# Patient Record
Sex: Male | Born: 1960 | Race: White | Hispanic: No | Marital: Married | Smoking: Current every day smoker
Health system: Southern US, Community
[De-identification: ages and names within clinical notes are randomized; demographics above are authoritative.]

## PROBLEM LIST (undated history)

## (undated) DIAGNOSIS — I10 Essential (primary) hypertension: Secondary | ICD-10-CM

## (undated) DIAGNOSIS — E785 Hyperlipidemia, unspecified: Secondary | ICD-10-CM

## (undated) DIAGNOSIS — M545 Low back pain, unspecified: Secondary | ICD-10-CM

## (undated) DIAGNOSIS — G8929 Other chronic pain: Secondary | ICD-10-CM

## (undated) HISTORY — DX: Hyperlipidemia, unspecified: E78.5

## (undated) HISTORY — DX: Low back pain, unspecified: M54.50

## (undated) HISTORY — PX: OTHER SURGICAL HISTORY: SHX169

## (undated) HISTORY — DX: Other chronic pain: G89.29

## (undated) HISTORY — DX: Essential (primary) hypertension: I10

---

## 2007-09-24 ENCOUNTER — Emergency Department: Payer: Self-pay | Admitting: Emergency Medicine

## 2015-02-18 ENCOUNTER — Encounter: Payer: Self-pay | Admitting: Emergency Medicine

## 2015-02-18 ENCOUNTER — Emergency Department
Admission: EM | Admit: 2015-02-18 | Discharge: 2015-02-18 | Disposition: A | Discharge status: Home / Self Care | Payer: BLUE CROSS/BLUE SHIELD | Attending: Emergency Medicine | Admitting: Emergency Medicine

## 2015-02-18 DIAGNOSIS — M545 Low back pain: Secondary | ICD-10-CM | POA: Diagnosis not present

## 2015-02-18 DIAGNOSIS — M5410 Radiculopathy, site unspecified: Secondary | ICD-10-CM

## 2015-02-18 DIAGNOSIS — M25551 Pain in right hip: Secondary | ICD-10-CM | POA: Diagnosis present

## 2015-02-18 MED ORDER — HYDROMORPHONE HCL 1 MG/ML IJ SOLN
1.0000 mg | Freq: Once | INTRAMUSCULAR | Status: AC
  Administered 2015-02-18: 1 mg via INTRAMUSCULAR
  Filled 2015-02-18: qty 1

## 2015-02-18 MED ORDER — TRAMADOL HCL 50 MG PO TABS: 50.0000 mg | ORAL_TABLET | Freq: Four times a day (QID) | ORAL | Status: AC | PRN

## 2015-02-18 NOTE — ED Notes (Signed)
Pt to ed with c/o right hip pain started Tuesday. Was given prednisone and flexeril for sciatica, but meds are not helping. Pt ambulated to stat desk with no difficulty noted.

## 2015-02-18 NOTE — ED Provider Notes (Signed)
[facility] Emergency Department Provider Note  ____________________________________________  Time seen: Approximately 6:47 PM  I have reviewed the triage vital signs and the nursing notes.   HISTORY  Chief Complaint Hip Pain    HPI Jeffrey Hunter is a 54 y.o. male patient complain of right hip back pain for 2 days. [facility] and diagnosed Sciatica. Patient given prescription for prednisone and Flexeril.Patient states continue pain and numbness to the right lower extremity. Patient denies any bladder or bowel dysfunction. She rates his pain discomfort as a 10 over 10. Scrub the pain in his back as sharp and a numbness to right entire leg.   History reviewed. No pertinent past medical history.  There are no active problems to display for this patient.   History reviewed. No pertinent past surgical history.  No current outpatient prescriptions on file.  Allergies Review of patient's allergies indicates no known allergies.  No family history on file.  Social History Social History  Substance Use Topics  . Smoking status: None  . Smokeless tobacco: None  . Alcohol Use: None    Review of Systems Constitutional: No fever/chills Eyes: No visual changes. ENT: No sore throat. Cardiovascular: Denies chest pain. Respiratory: Denies shortness of breath. Gastrointestinal: No abdominal pain.  No nausea, no vomiting.  No diarrhea.  No constipation. Genitourinary: Negative for dysuria. Musculoskeletal: Negative for back pain. Skin: Negative for rash. Neurological: Negative for headaches. Numbness to right leg. 10-point ROS otherwise negative.  ____________________________________________   PHYSICAL EXAM:  VITAL SIGNS: ED Triage Vitals  Enc Vitals Group     BP 02/18/15 1824 169/85 mmHg     Pulse Rate 02/18/15 1824 76     Resp 02/18/15 1824 20     Temp 02/18/15 1824 98.4 F (36.9 C)     Temp Source  02/18/15 1824 Oral     SpO2 02/18/15 1824 99 %     Weight 02/18/15 1824 200 lb (90.719 kg)     Height 02/18/15 1824 5\' 9"  (1.753 m)     Head Cir --      Peak Flow --      Pain Score 02/18/15 1819 10     Pain Loc --      Pain Edu? --      Excl. in GC? --     Constitutional: Alert and oriented. Well appearing and in no acute distress. Eyes: Conjunctivae are normal. PERRL. EOMI. Head: Atraumatic. Nose: No congestion/rhinnorhea. Mouth/Throat: Mucous membranes are moist.  Oropharynx non-erythematous. Neck: No stridor.  No cervical spine tenderness to palpation. Hematological/Lymphatic/Immunilogical: No cervical lymphadenopathy. Cardiovascular: Normal rate, regular rhythm. Grossly normal heart sounds.  Good peripheral circulation. Respiratory: Normal respiratory effort.  No retractions. Lungs CTAB. Gastrointestinal: Soft and nontender. No distention. No abdominal bruits. No CVA tenderness. Musculoskeletal: Patient sits to stand repeat reliance on the upper extremities. No spinal deformity however the patient stays in a flexed position upon standing. Patient has some moderate guarding with palpation of L3-L5. Patient has a straight leg test at 60. Neurologic:  Normal speech and language. No gross focal neurologic deficits are appreciated. No gait instability. Skin:  Skin is warm, dry and intact. No rash noted. Psychiatric: Mood and affect are normal. Speech and behavior are normal.  ____________________________________________   LABS (all labs ordered are listed, but only abnormal results are displayed)  Labs Reviewed - No data to display ____________________________________________  EKG   ____________________________________________  RADIOLOGY  ____________________________________________   PROCEDURES  Procedure(s) performed: None  Critical Care performed: No  ____________________________________________   INITIAL IMPRESSION / ASSESSMENT AND PLAN / ED  COURSE  Pertinent labs & imaging results that were available during my care of the patient were reviewed by me and considered in my medical decision making (see chart for details).  Radicular right low back pain. Advised patient to continue previous medications. He is given a prescription for tramadol to take as directed. Patient advised follow-up with Ortho clinic. ____________________________________________   FINAL CLINICAL IMPRESSION(S) / ED DIAGNOSES  Final diagnoses:  Acute low back pain with radicular symptoms, duration less than 6 weeks      Joni Reining, PA-C 02/18/15 1901  Jene Every, MD 02/18/15 2120

## 2015-02-18 NOTE — Discharge Instructions (Signed)
Radicular Pain °Radicular pain in either the arm or leg is usually from a bulging or herniated disk in the spine. A piece of the herniated disk may press against the nerves as the nerves exit the spine. This causes pain which is felt at the tips of the nerves down the arm or leg. Other causes of radicular pain may include: °· Fractures. °· Heart disease. °· Cancer. °· An abnormal and usually degenerative state of the nervous system or nerves (neuropathy). °Diagnosis may require CT or MRI scanning to determine the primary cause.  °Nerves that start at the neck (nerve roots) may cause radicular pain in the outer shoulder and arm. It can spread down to the thumb and fingers. The symptoms vary depending on which nerve root has been affected. In most cases radicular pain improves with conservative treatment. Neck problems may require physical therapy, a neck collar, or cervical traction. Treatment may take many weeks, and surgery may be considered if the symptoms do not improve.  °Conservative treatment is also recommended for sciatica. Sciatica causes pain to radiate from the lower back or buttock area down the leg into the foot. Often there is a history of back problems. Most patients with sciatica are better after 2 to 4 weeks of rest and other supportive care. Short term bed rest can reduce the disk pressure considerably. Sitting, however, is not a good position since this increases the pressure on the disk. You should avoid bending, lifting, and all other activities which make the problem worse. Traction can be used in severe cases. Surgery is usually reserved for patients who do not improve within the first months of treatment. °Only take over-the-counter or prescription medicines for pain, discomfort, or fever as directed by your caregiver. Narcotics and muscle relaxants may help by relieving more severe pain and spasm and by providing mild sedation. Cold or massage can give significant relief. Spinal manipulation  is not recommended. It can increase the degree of disc protrusion. Epidural steroid injections are often effective treatment for radicular pain. These injections deliver medicine to the spinal nerve in the space between the protective covering of the spinal cord and back bones (vertebrae). Your caregiver can give you more information about steroid injections. These injections are most effective when given within two weeks of the onset of pain.  °You should see your caregiver for follow up care as recommended. A program for neck and back injury rehabilitation with stretching and strengthening exercises is an important part of management.  °SEEK IMMEDIATE MEDICAL CARE IF: °· You develop increased pain, weakness, or numbness in your arm or leg. °· You develop difficulty with bladder or bowel control. °· You develop abdominal pain. °  °This information is not intended to replace advice given to you by your health care provider. Make sure you discuss any questions you have with your health care provider. °  °Document Released: 05/18/2004 Document Revised: 05/01/2014 Document Reviewed: 11/04/2014 °Elsevier Interactive Patient Education ©2016 [employer] ° °

## 2015-03-12 ENCOUNTER — Other Ambulatory Visit: Payer: Self-pay | Admitting: Orthopedic Surgery

## 2015-03-12 DIAGNOSIS — M4727 Other spondylosis with radiculopathy, lumbosacral region: Secondary | ICD-10-CM

## 2015-04-02 ENCOUNTER — Ambulatory Visit
Admission: RE | Admit: 2015-04-02 | Discharge: 2015-04-02 | Disposition: A | Discharge status: Home / Self Care | Payer: BLUE CROSS/BLUE SHIELD | Source: Ambulatory Visit | Attending: Orthopedic Surgery | Admitting: Orthopedic Surgery

## 2015-04-02 DIAGNOSIS — M5126 Other intervertebral disc displacement, lumbar region: Secondary | ICD-10-CM | POA: Insufficient documentation

## 2015-04-02 DIAGNOSIS — M4806 Spinal stenosis, lumbar region: Secondary | ICD-10-CM | POA: Diagnosis not present

## 2015-04-02 DIAGNOSIS — M4727 Other spondylosis with radiculopathy, lumbosacral region: Secondary | ICD-10-CM | POA: Diagnosis present

## 2015-04-02 DIAGNOSIS — R200 Anesthesia of skin: Secondary | ICD-10-CM | POA: Diagnosis present

## 2015-04-02 DIAGNOSIS — M545 Low back pain: Secondary | ICD-10-CM | POA: Diagnosis present

## 2016-04-06 IMAGING — MR MR LUMBAR SPINE W/O CM
5 series · 36 of 48 positions shown · non-contrast
Comparison: None.

CLINICAL DATA: Sudden onset low back pain. Right-sided pain. Right
leg anterior and laterally.

EXAM:
MRI LUMBAR SPINE WITHOUT CONTRAST
TECHNIQUE: Multiplanar, multisequence MR imaging of the lumbar spine was
performed. No intravenous contrast was administered.

[Series 2: T2 · sagittal · 4.0mm · 0.81mm/px · 6 of 17 slices shown (1 of 2)]
[im 1/17]
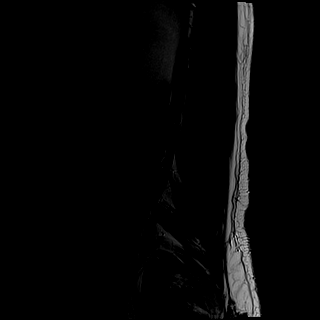
[im 4/17]
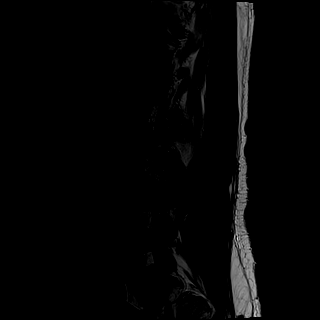
[im 7/17]
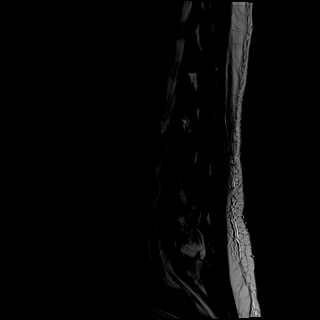
[im 10/17]
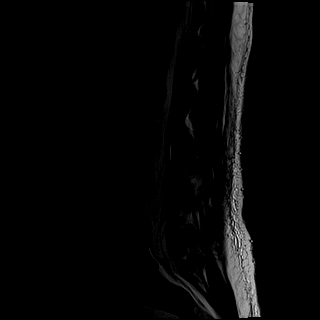
[im 13/17]
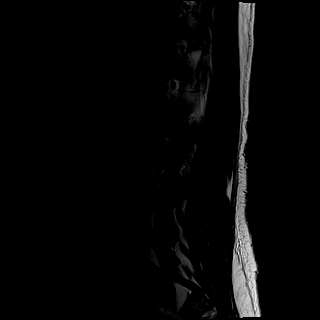
[im 17/17]
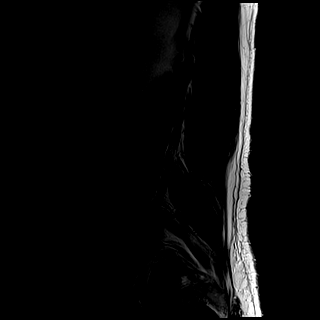

[Series 3: T1 · sagittal · 4.0mm · 0.81mm/px · 6 of 17 slices shown (1 of 2)]
[im 1/17]
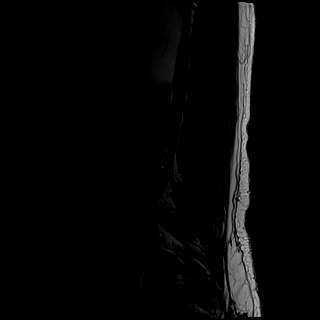
[im 4/17]
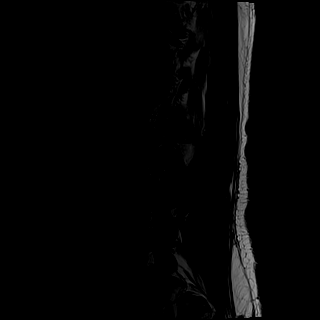
[im 7/17]
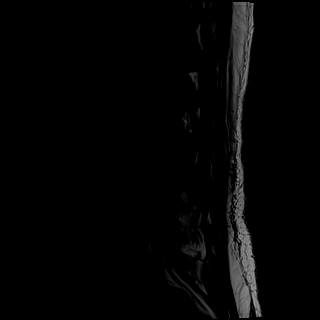
[im 10/17]
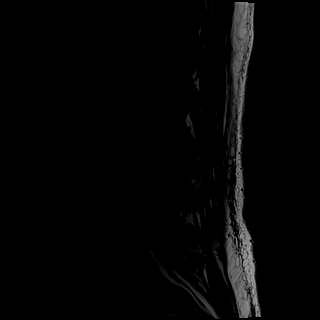
[im 13/17]
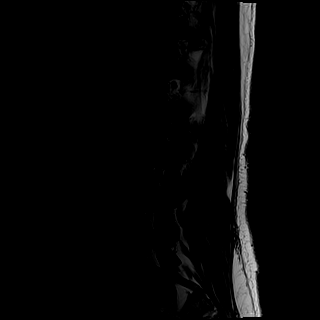
[im 17/17]
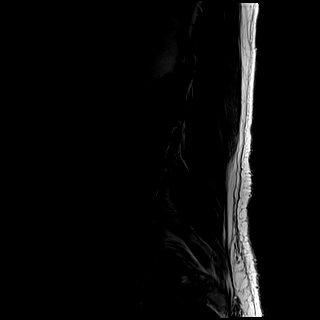

[Series 4: STIR · sagittal · 4.0mm · 1.02mm/px · 6 of 17 slices shown]
[im 1/17]
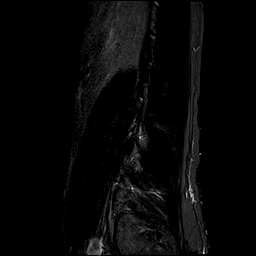
[im 4/17]
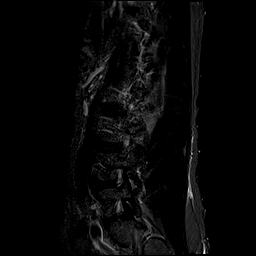
[im 7/17]
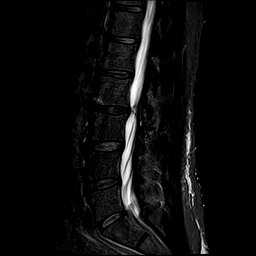
[im 10/17]
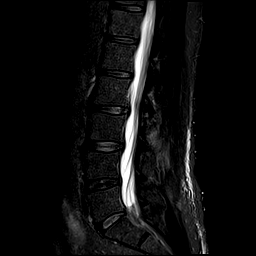
[im 13/17]
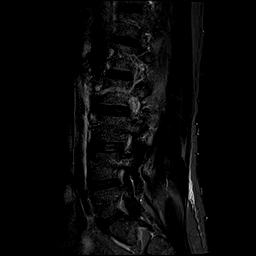
[im 17/17]
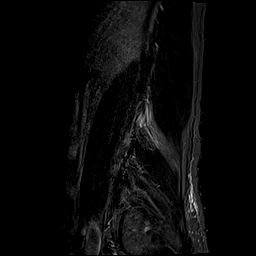

[Series 5: T2 · axial · 4.0mm · 0.78mm/px · z∈[-63,+161]mm · 9 of 43 slices shown (2 of 2)]
[im 1/43]
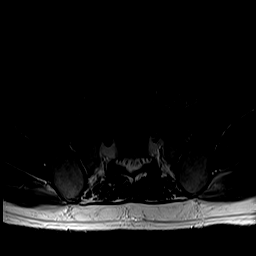
[im 7/43]
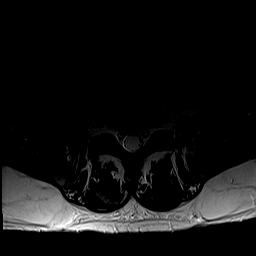
[im 13/43]
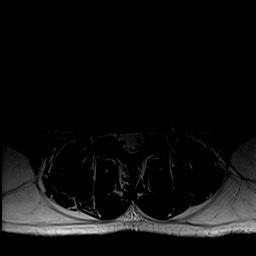
[im 19/43]
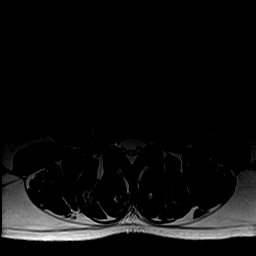
[im 22/43]
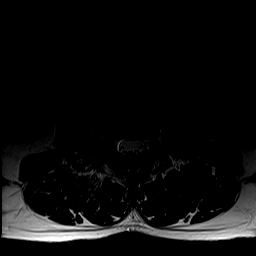
[im 25/43]
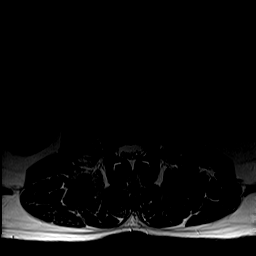
[im 31/43]
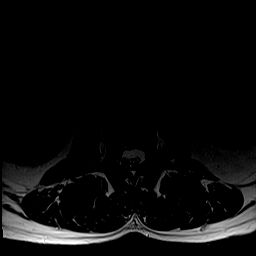
[im 37/43]
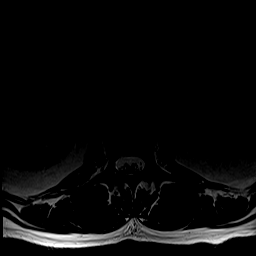
[im 43/43]
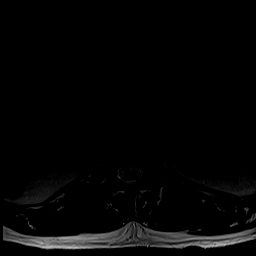

[Series 6: T1 · axial · 4.0mm · 0.39mm/px · z∈[-63,+161]mm · 9 of 43 slices shown (2 of 2)]
[im 1/43]
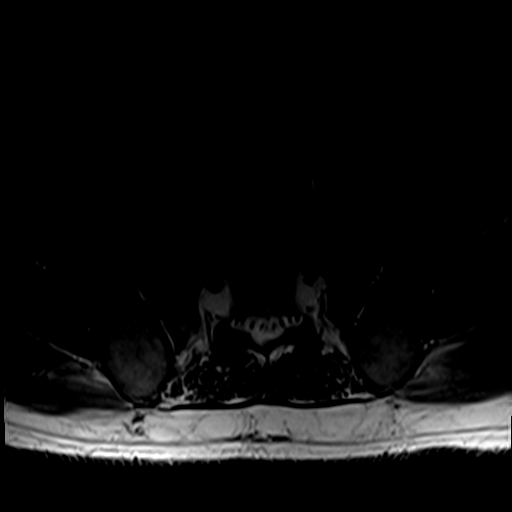
[im 7/43]
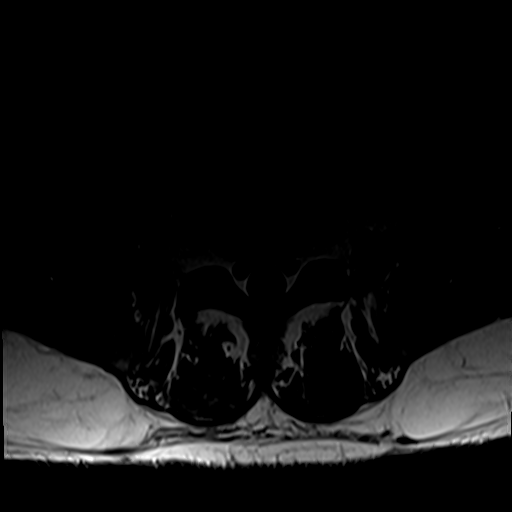
[im 13/43]
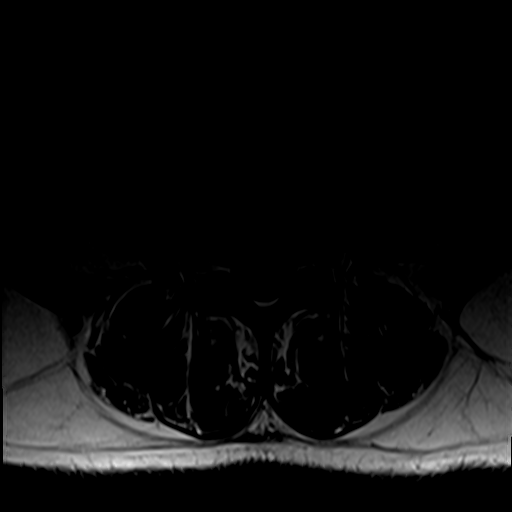
[im 19/43]
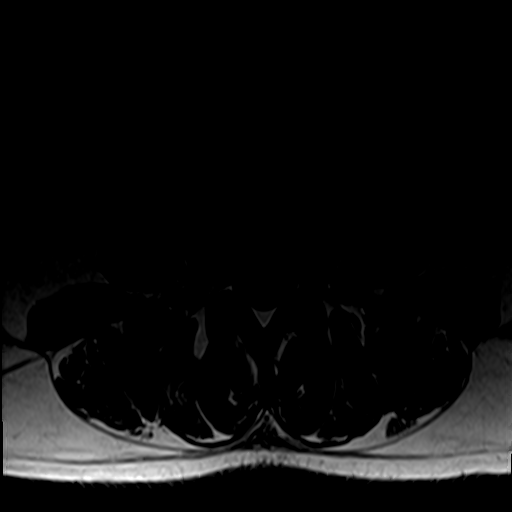
[im 22/43]
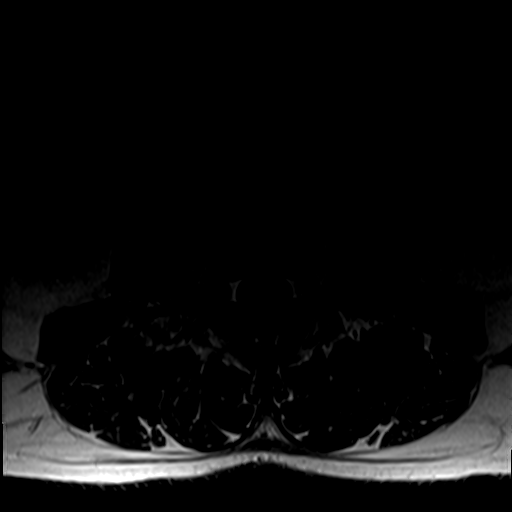
[im 25/43]
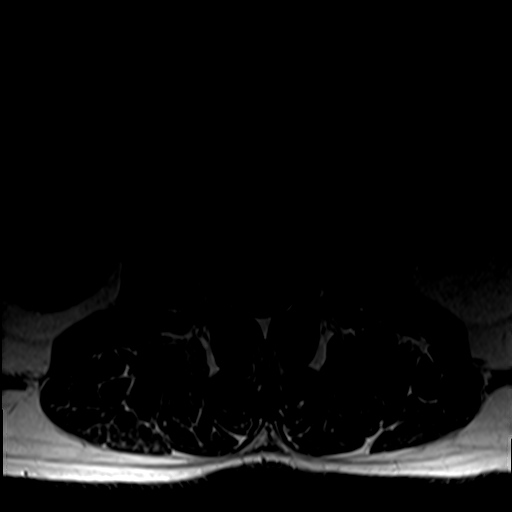
[im 31/43]
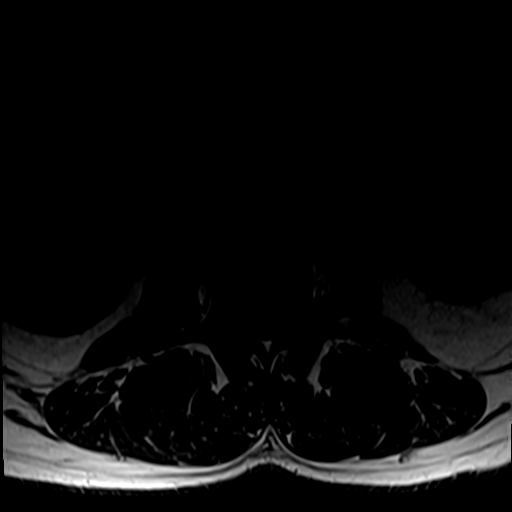
[im 37/43]
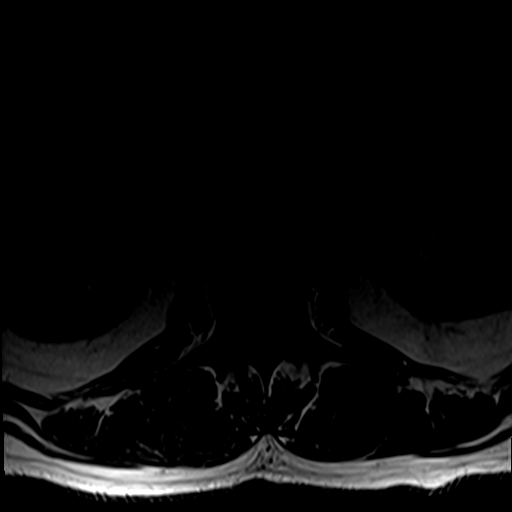
[im 43/43]
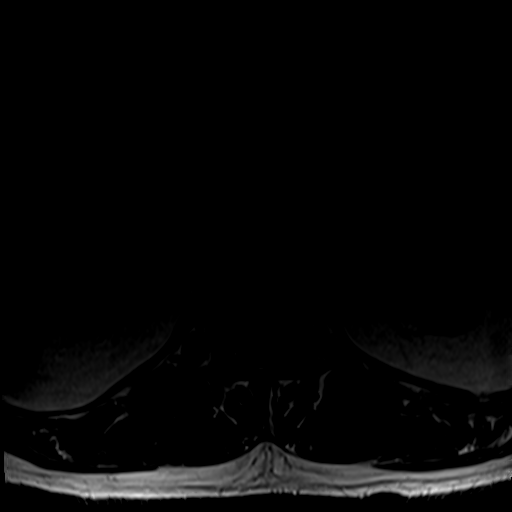

[36 of 48 positions shown; findings below may reference images not displayed]

FINDINGS: The vertebral bodies of the lumbar spine are normal in size. The
vertebral bodies of the lumbar spine are normal in alignment. There
is normal bone marrow signal demonstrated throughout the vertebra.
The intervertebral disc spaces are well-maintained.

The spinal cord is normal in signal and contour. The cord terminates
normally at L1 . The nerve roots of the cauda equina and the filum
terminale are normal.

The visualized portions of the SI joints are unremarkable.

The imaged intra-abdominal contents are unremarkable.

T12-L1: No significant disc bulge. No evidence of neural foraminal
stenosis. No central canal stenosis.

L1-L2: No significant disc bulge. No foraminal stenosis. No central
canal stenosis.

L2-L3: Large right foraminal disc protrusion with mass effect on the
L2 nerve root. Superior migration of disc material which extends
into the right paracentral aspect of the spinal canal with mild mass
effect on the intraspinal right L2 nerve root. Severe right
foraminal stenosis. Moderate bilateral facet arthropathy.

L3-L4: No significant disc bulge. No evidence of neural foraminal
stenosis. No central canal stenosis. Mild bilateral facet
arthropathy.

L4-L5: No significant disc bulge. No evidence of neural foraminal
stenosis. No central canal stenosis. Mild bilateral facet
arthropathy.

L5-S1: No significant disc bulge. No evidence of neural foraminal
stenosis. No central canal stenosis. Mild bilateral facet
arthropathy.
IMPRESSION: 1. At L2-3 there is a large right foraminal disc protrusion with
mass effect on the L2 nerve root. Superior migration of disc
material which extends into the right paracentral aspect of the
spinal canal with mild mass effect on the intraspinal right L2 nerve
root. Severe right foraminal stenosis. Moderate bilateral facet
arthropathy.

## 2019-06-19 ENCOUNTER — Encounter: Payer: Self-pay | Admitting: Family Medicine

## 2019-06-19 ENCOUNTER — Ambulatory Visit: Payer: Managed Care, Other (non HMO) | Admitting: Family Medicine

## 2019-06-19 ENCOUNTER — Other Ambulatory Visit: Payer: Self-pay

## 2019-06-19 VITALS — BP 161/95 | HR 76 | Temp 98.5°F | Ht 68.5 in | Wt 211.0 lb

## 2019-06-19 DIAGNOSIS — Z125 Encounter for screening for malignant neoplasm of prostate: Secondary | ICD-10-CM

## 2019-06-19 DIAGNOSIS — Z114 Encounter for screening for human immunodeficiency virus [HIV]: Secondary | ICD-10-CM

## 2019-06-19 DIAGNOSIS — Z0000 Encounter for general adult medical examination without abnormal findings: Secondary | ICD-10-CM | POA: Diagnosis not present

## 2019-06-19 DIAGNOSIS — Z1159 Encounter for screening for other viral diseases: Secondary | ICD-10-CM | POA: Diagnosis not present

## 2019-06-19 DIAGNOSIS — Z23 Encounter for immunization: Secondary | ICD-10-CM

## 2019-06-19 DIAGNOSIS — R030 Elevated blood-pressure reading, without diagnosis of hypertension: Secondary | ICD-10-CM | POA: Diagnosis not present

## 2019-06-19 DIAGNOSIS — M545 Low back pain, unspecified: Secondary | ICD-10-CM

## 2019-06-19 DIAGNOSIS — Z7689 Persons encountering health services in other specified circumstances: Secondary | ICD-10-CM

## 2019-06-19 DIAGNOSIS — Z1211 Encounter for screening for malignant neoplasm of colon: Secondary | ICD-10-CM

## 2019-06-19 LAB — UA/M W/RFLX CULTURE, ROUTINE
Bilirubin, UA: NEGATIVE
Glucose, UA: NEGATIVE
Leukocytes,UA: NEGATIVE
Nitrite, UA: NEGATIVE
RBC, UA: NEGATIVE
Specific Gravity, UA: 1.025 (ref 1.005–1.030)
Urobilinogen, Ur: 1 mg/dL (ref 0.2–1.0)
pH, UA: 6 (ref 5.0–7.5)

## 2019-06-19 LAB — MICROSCOPIC EXAMINATION
Bacteria, UA: NONE SEEN
RBC: NONE SEEN /hpf (ref 0–2)
WBC, UA: NONE SEEN /hpf (ref 0–5)

## 2019-06-19 MED ORDER — MELOXICAM 15 MG PO TABS: 15.0000 mg | ORAL_TABLET | Freq: Every day | ORAL | 0 refills | Status: DC

## 2019-06-19 MED ORDER — CYCLOBENZAPRINE HCL 10 MG PO TABS: 10.0000 mg | ORAL_TABLET | Freq: Three times a day (TID) | ORAL | 0 refills | Status: DC | PRN

## 2019-06-20 LAB — LIPID PANEL W/O CHOL/HDL RATIO
Cholesterol, Total: 209 mg/dL — ABNORMAL HIGH (ref 100–199)
HDL: 35 mg/dL — ABNORMAL LOW (ref >39)
LDL Chol Calc (NIH): 130 mg/dL — ABNORMAL HIGH (ref 0–99)
Triglycerides: 247 mg/dL — ABNORMAL HIGH (ref 0–149)
VLDL Cholesterol Cal: 44 mg/dL — ABNORMAL HIGH (ref 5–40)

## 2019-06-20 LAB — HIV ANTIBODY (ROUTINE TESTING W REFLEX): HIV Screen 4th Generation wRfx: NONREACTIVE

## 2019-06-20 LAB — COMPREHENSIVE METABOLIC PANEL
ALT: 16 IU/L (ref 0–44)
AST: 15 IU/L (ref 0–40)
Albumin/Globulin Ratio: 1.8 (ref 1.2–2.2)
Albumin: 4.4 g/dL (ref 3.8–4.9)
Alkaline Phosphatase: 101 IU/L (ref 39–117)
BUN/Creatinine Ratio: 17 (ref 9–20)
BUN: 18 mg/dL (ref 6–24)
Bilirubin Total: 0.4 mg/dL (ref 0.0–1.2)
CO2: 25 mmol/L (ref 20–29)
Calcium: 9.5 mg/dL (ref 8.7–10.2)
Chloride: 103 mmol/L (ref 96–106)
Creatinine, Ser: 1.07 mg/dL (ref 0.76–1.27)
GFR calc Af Amer: 87 mL/min/{1.73_m2} (ref >59)
GFR calc non Af Amer: 76 mL/min/{1.73_m2} (ref >59)
Globulin, Total: 2.5 g/dL (ref 1.5–4.5)
Glucose: 103 mg/dL — ABNORMAL HIGH (ref 65–99)
Potassium: 4 mmol/L (ref 3.5–5.2)
Sodium: 143 mmol/L (ref 134–144)
Total Protein: 6.9 g/dL (ref 6.0–8.5)

## 2019-06-20 LAB — CBC WITH DIFFERENTIAL/PLATELET
Basophils Absolute: 0 10*3/uL (ref 0.0–0.2)
Basos: 1 %
EOS (ABSOLUTE): 0.2 10*3/uL (ref 0.0–0.4)
Eos: 3 %
Hematocrit: 49.8 % (ref 37.5–51.0)
Hemoglobin: 17 g/dL (ref 13.0–17.7)
Immature Grans (Abs): 0 10*3/uL (ref 0.0–0.1)
Immature Granulocytes: 0 %
Lymphocytes Absolute: 1 10*3/uL (ref 0.7–3.1)
Lymphs: 13 %
MCH: 28.9 pg (ref 26.6–33.0)
MCHC: 34.1 g/dL (ref 31.5–35.7)
MCV: 85 fL (ref 79–97)
Monocytes Absolute: 0.4 10*3/uL (ref 0.1–0.9)
Monocytes: 5 %
Neutrophils Absolute: 6 10*3/uL (ref 1.4–7.0)
Neutrophils: 78 %
Platelets: 200 10*3/uL (ref 150–450)
RBC: 5.89 x10E6/uL — ABNORMAL HIGH (ref 4.14–5.80)
RDW: 13.1 % (ref 11.6–15.4)
WBC: 7.6 10*3/uL (ref 3.4–10.8)

## 2019-06-20 LAB — HEPATITIS C ANTIBODY: Hep C Virus Ab: <0.1 s/co ratio (ref 0.0–0.9)

## 2019-06-20 LAB — PSA: Prostate Specific Ag, Serum: 0.4 ng/mL (ref 0.0–4.0)

## 2019-06-21 NOTE — Progress Notes (Signed)
BP (!) 161/95   Pulse 76   Temp 98.5 F (36.9 C) (Oral)   Ht 5' 8.5" (1.74 m)   Wt 211 lb (95.7 kg)   SpO2 93%   BMI 31.62 kg/m    Subjective:    Patient ID: Jeffrey Hunter, male    DOB: 1961/03/28, 59 y.o.   MRN: [id number]  HPI: Jeffrey Hunter is a 59 y.o. male presenting on 06/19/2019 for comprehensive medical examination. Current medical complaints include:see below  Patient presenting today to [facility] care.   Started about 3 years ago with some severe low back issues on the right side, had injections through Dr. Yves Dill back at that time with excellent relief up until the past week. Now happening on the opposite side of back but feeling the same way, concerned about progression as it became debilitating in the past. Not currently taking anything for the pain. Denies radiation down legs, numbness, tingling, weakness, injury.   Otherwise, no known medical problems. Has not had a regular check up in many years.   He currently lives with: Interim Problems from his last visit: no  Depression Screen done today and results listed below:  Depression screen [facility] 2/9 06/19/2019  Decreased Interest 0  Down, Depressed, Hopeless 0  PHQ - 2 Score 0  Altered sleeping 0  Tired, decreased energy 1  Change in appetite 0  Feeling bad or failure about yourself  0  Trouble concentrating 0  Moving slowly or fidgety/restless 0  Suicidal thoughts 0  PHQ-9 Score 1    The patient does not have a history of falls. I did complete a risk assessment for falls. A plan of care for falls was documented.   Past Medical History:  History reviewed. No pertinent past medical history.  Surgical History:  History reviewed. No pertinent surgical history.  Medications:  Current Outpatient Medications on File Prior to Visit  Medication Sig  . ASPIRIN 81 PO Take by mouth as needed.   No current facility-administered medications on file prior to visit.    Allergies:  No Known  Allergies  Social History:  Social History   Socioeconomic History  . Marital status: Married    Spouse name: Not on file  . Number of children: Not on file  . Years of education: Not on file  . Highest education level: Not on file  Occupational History  . Not on file  Tobacco Use  . Smoking status: Current Every Day Smoker    Types: Cigarettes  . Smokeless tobacco: Never Used  Substance and Sexual Activity  . Alcohol use: Yes    Comment: ocassionally  . Drug use: Never  . Sexual activity: Yes  Other Topics Concern  . Not on file  Social History Narrative  . Not on file   Social Determinants of Health   Financial Resource Strain:   . Difficulty of Paying Living Expenses: Not on file  Food Insecurity:   . Worried About Programme researcher, broadcasting/film/video in the Last Year: Not on file  . Ran Out of Food in the Last Year: Not on file  Transportation Needs:   . Lack of Transportation (Medical): Not on file  . Lack of Transportation (Non-Medical): Not on file  Physical Activity:   . Days of Exercise per Week: Not on file  . Minutes of Exercise per Session: Not on file  Stress:   . Feeling of Stress : Not on file  Social Connections:   . Frequency of  Communication with Friends and Family: Not on file  . Frequency of Social Gatherings with Friends and Family: Not on file  . Attends Religious Services: Not on file  . Active Member of Clubs or Organizations: Not on file  . Attends Banker Meetings: Not on file  . Marital Status: Not on file  Intimate Partner Violence:   . Fear of Current or Ex-Partner: Not on file  . Emotionally Abused: Not on file  . Physically Abused: Not on file  . Sexually Abused: Not on file   Social History   Tobacco Use  Smoking Status Current Every Day Smoker  . Types: Cigarettes  Smokeless Tobacco Never Used   Social History   Substance and Sexual Activity  Alcohol Use Yes   Comment: ocassionally    Family History:  Family History   Problem Relation Age of Onset  . Diabetes Sister   . Heart attack Paternal Grandfather     Past medical history, surgical history, medications, allergies, family history and social history reviewed with patient today and changes made to appropriate areas of the chart.   Review of Systems - General ROS: negative Psychological ROS: negative Ophthalmic ROS: negative ENT ROS: negative Allergy and Immunology ROS: negative Hematological and Lymphatic ROS: negative Endocrine ROS: negative Breast ROS: negative for breast lumps Respiratory ROS: no cough, shortness of breath, or wheezing Cardiovascular ROS: no chest pain or dyspnea on exertion Gastrointestinal ROS: no abdominal pain, change in bowel habits, or black or bloody stools Genito-Urinary ROS: no dysuria, trouble voiding, or hematuria Musculoskeletal ROS: positive for - back pain Neurological ROS: no TIA or stroke symptoms Dermatological ROS: negative All other ROS negative except what is listed above and in the HPI.      Objective:    BP (!) 161/95   Pulse 76   Temp 98.5 F (36.9 C) (Oral)   Ht 5' 8.5" (1.74 m)   Wt 211 lb (95.7 kg)   SpO2 93%   BMI 31.62 kg/m   Wt Readings from Last 3 Encounters:  06/19/19 211 lb (95.7 kg)  02/18/15 200 lb (90.7 kg)    Physical Exam Vitals and nursing note reviewed.  Constitutional:      General: He is not in acute distress.    Appearance: He is well-developed.  HENT:     Head: Atraumatic.     Right Ear: Tympanic membrane and external ear normal.     Left Ear: Tympanic membrane and external ear normal.     Nose: Nose normal.     Mouth/Throat:     Mouth: Mucous membranes are moist.     Pharynx: Oropharynx is clear.  Eyes:     General: No scleral icterus.    Conjunctiva/sclera: Conjunctivae normal.     Pupils: Pupils are equal, round, and reactive to light.  Cardiovascular:     Rate and Rhythm: Normal rate and regular rhythm.     Pulses: Normal pulses.     Heart sounds:  Normal heart sounds. No murmur.  Pulmonary:     Effort: Pulmonary effort is normal. No respiratory distress.     Breath sounds: Normal breath sounds.  Abdominal:     General: Bowel sounds are normal. There is no distension.     Palpations: Abdomen is soft. There is no mass.     Tenderness: There is no abdominal tenderness. There is no guarding.  Genitourinary:    Comments: GU exam declined Musculoskeletal:        General:  Tenderness (left paraspinal muscle ttp in lumbar region) present. Normal range of motion.     Cervical back: Normal range of motion and neck supple.  Skin:    General: Skin is warm and dry.     Findings: No rash.  Neurological:     General: No focal deficit present.     Mental Status: He is alert and oriented to person, place, and time.     Sensory: No sensory deficit.     Motor: No weakness.     Gait: Gait normal.     Deep Tendon Reflexes: Reflexes are normal and symmetric.  Psychiatric:        Mood and Affect: Mood normal.        Behavior: Behavior normal.        Thought Content: Thought content normal.        Judgment: Judgment normal.     Results for orders placed or performed in visit on 06/19/19  Microscopic Examination   URINE  Result Value Ref Range   WBC, UA None seen 0 - 5 /hpf   RBC None seen 0 - 2 /hpf   Epithelial Cells (non renal) 0-10 0 - 10 /hpf   Casts Present None seen /lpf   Cast Type Granular casts (A) N/A   Mucus, UA Present Not Estab.   Bacteria, UA None seen None seen/Few  CBC with Differential/Platelet  Result Value Ref Range   WBC 7.6 3.4 - 10.8 x10E3/uL   RBC 5.89 (H) 4.14 - 5.80 x10E6/uL   Hemoglobin 17.0 13.0 - 17.7 g/dL   Hematocrit 29.5 62.1 - 51.0 %   MCV 85 79 - 97 fL   MCH 28.9 26.6 - 33.0 pg   MCHC 34.1 31.5 - 35.7 g/dL   RDW 30.8 65.7 - 84.6 %   Platelets 200 150 - 450 x10E3/uL   Neutrophils 78 Not Estab. %   Lymphs 13 Not Estab. %   Monocytes 5 Not Estab. %   Eos 3 Not Estab. %   Basos 1 Not Estab. %    Neutrophils Absolute 6.0 1.4 - 7.0 x10E3/uL   Lymphocytes Absolute 1.0 0.7 - 3.1 x10E3/uL   Monocytes Absolute 0.4 0.1 - 0.9 x10E3/uL   EOS (ABSOLUTE) 0.2 0.0 - 0.4 x10E3/uL   Basophils Absolute 0.0 0.0 - 0.2 x10E3/uL   Immature Granulocytes 0 Not Estab. %   Immature Grans (Abs) 0.0 0.0 - 0.1 x10E3/uL  Comprehensive metabolic panel  Result Value Ref Range   Glucose 103 (H) 65 - 99 mg/dL   BUN 18 6 - 24 mg/dL   Creatinine, Ser 9.62 0.76 - 1.27 mg/dL   GFR calc non Af Amer 76 >59 mL/min/1.73   GFR calc Af Amer 87 >59 mL/min/1.73   BUN/Creatinine Ratio 17 9 - 20   Sodium 143 134 - 144 mmol/L   Potassium 4.0 3.5 - 5.2 mmol/L   Chloride 103 96 - 106 mmol/L   CO2 25 20 - 29 mmol/L   Calcium 9.5 8.7 - 10.2 mg/dL   Total Protein 6.9 6.0 - 8.5 g/dL   Albumin 4.4 3.8 - 4.9 g/dL   Globulin, Total 2.5 1.5 - 4.5 g/dL   Albumin/Globulin Ratio 1.8 1.2 - 2.2   Bilirubin Total 0.4 0.0 - 1.2 mg/dL   Alkaline Phosphatase 101 39 - 117 IU/L   AST 15 0 - 40 IU/L   ALT 16 0 - 44 IU/L  Lipid Panel w/o Chol/HDL Ratio  Result Value Ref Range  Cholesterol, Total 209 (H) 100 - 199 mg/dL   Triglycerides 782 (H) 0 - 149 mg/dL   HDL 35 (L) >42 mg/dL   VLDL Cholesterol Cal 44 (H) 5 - 40 mg/dL   LDL Chol Calc (NIH) 353 (H) 0 - 99 mg/dL  UA/M w/rflx Culture, Routine   Specimen: Urine   URINE  Result Value Ref Range   Specific Gravity, UA 1.025 1.005 - 1.030   pH, UA 6.0 5.0 - 7.5   Color, UA Yellow Yellow   Appearance Ur Clear Clear   Leukocytes,UA Negative Negative   Protein,UA Trace (A) Negative/Trace   Glucose, UA Negative Negative   Ketones, UA Trace (A) Negative   RBC, UA Negative Negative   Bilirubin, UA Negative Negative   Urobilinogen, Ur 1.0 0.2 - 1.0 mg/dL   Nitrite, UA Negative Negative   Microscopic Examination See below:   PSA  Result Value Ref Range   Prostate Specific Ag, Serum 0.4 0.0 - 4.0 ng/mL  Hepatitis C antibody  Result Value Ref Range   Hep C Virus Ab <0.1 0.0 - 0.9  s/co ratio  HIV Antibody (routine testing w rflx)  Result Value Ref Range   HIV Screen 4th Generation wRfx Non Reactive Non Reactive      Assessment & Plan:   Problem List Items Addressed This Visit    None    Visit Diagnoses    Acute left-sided low back pain without sciatica    -  Primary   Acute on chronic, start home exercises, flexeril and meloxicam prn, lidocaine patches, heating pad. Will refer back for injections if not improving   Relevant Medications   ASPIRIN 81 PO   meloxicam (MOBIC) 15 MG tablet   cyclobenzaprine (FLEXERIL) 10 MG tablet   Encounter to establish care       Annual physical exam       Relevant Orders   CBC with Differential/Platelet (Completed)   Comprehensive metabolic panel (Completed)   Lipid Panel w/o Chol/HDL Ratio (Completed)   UA/M w/rflx Culture, Routine (Completed)   Single episode of elevated blood pressure       Do not have a good baseline prior to today for HTN dx, monitor home readings,DASH diet, weight loss. Will recheck at next OV   Screening for colon cancer       Relevant Orders   Cologuard   Encounter for screening for HIV       Relevant Orders   HIV Antibody (routine testing w rflx) (Completed)   Need for hepatitis C screening test       Relevant Orders   Hepatitis C antibody (Completed)   Screening for prostate cancer       Relevant Orders   PSA (Completed)   Need for diphtheria-tetanus-pertussis (Tdap) vaccine       Relevant Orders   Tdap vaccine greater than or equal to 7yo IM (Completed)       Discussed aspirin prophylaxis for myocardial infarction prevention and decision was made to continue ASA  LABORATORY TESTING:  Health maintenance labs ordered today as discussed above.   The natural history of prostate cancer and ongoing controversy regarding screening and potential treatment outcomes of prostate cancer has been discussed with the patient. The meaning of a false positive PSA and a false negative PSA has been  discussed. He indicates understanding of the limitations of this screening test and wishes to proceed with screening PSA testing.   IMMUNIZATIONS:   - Tdap: Tetanus vaccination status reviewed:  Td vaccination indicated and given today. - Influenza: Refused  SCREENING: - Colonoscopy: cologuard ordered  Discussed with patient purpose of the colonoscopy is to detect colon cancer at curable precancerous or early stages   PATIENT COUNSELING:    Sexuality: Discussed sexually transmitted diseases, partner selection, use of condoms, avoidance of unintended pregnancy  and contraceptive alternatives.   Advised to avoid cigarette smoking.  I discussed with the patient that most people either abstain from alcohol or drink within safe limits (<=14/week and <=4 drinks/occasion for males, <=7/weeks and <= 3 drinks/occasion for females) and that the risk for alcohol disorders and other health effects rises proportionally with the number of drinks per week and how often a drinker exceeds daily limits.  Discussed cessation/primary prevention of drug use and availability of treatment for abuse.   Diet: Encouraged to adjust caloric intake to maintain  or achieve ideal body weight, to reduce intake of dietary saturated fat and total fat, to limit sodium intake by avoiding high sodium foods and not adding table salt, and to maintain adequate dietary potassium and calcium preferably from fresh fruits, vegetables, and low-fat dairy products.    stressed the importance of regular exercise  Injury prevention: Discussed safety belts, safety helmets, smoke detector, smoking near bedding or upholstery.   Dental health: Discussed importance of regular tooth brushing, flossing, and dental visits.   Follow up plan: NEXT PREVENTATIVE PHYSICAL DUE IN 1 YEAR. Return for to be determined based on lab results.

## 2019-07-04 ENCOUNTER — Encounter: Payer: Self-pay | Admitting: Family Medicine

## 2019-07-17 ENCOUNTER — Other Ambulatory Visit: Payer: Self-pay | Admitting: Family Medicine

## 2019-07-18 NOTE — Telephone Encounter (Signed)
Patient last seen 06/19/19 and has appointment 12/18/19 

## 2019-07-20 ENCOUNTER — Other Ambulatory Visit: Payer: Self-pay | Admitting: Family Medicine

## 2019-07-20 LAB — COLOGUARD

## 2019-07-21 MED ORDER — MELOXICAM 15 MG PO TABS: 15.0000 mg | ORAL_TABLET | Freq: Every day | ORAL | 0 refills | Status: DC

## 2019-07-21 MED ORDER — CYCLOBENZAPRINE HCL 10 MG PO TABS: 10.0000 mg | ORAL_TABLET | Freq: Three times a day (TID) | ORAL | 0 refills | Status: DC | PRN

## 2019-07-21 NOTE — Telephone Encounter (Signed)
Patient last seen 06/19/19 and has appointment 12/18/19

## 2019-07-23 LAB — COLOGUARD
COLOGUARD: NEGATIVE
Cologuard: NEGATIVE

## 2019-07-24 ENCOUNTER — Encounter: Payer: Self-pay | Admitting: Family Medicine

## 2019-12-18 ENCOUNTER — Ambulatory Visit: Payer: Managed Care, Other (non HMO) | Admitting: Family Medicine

## 2019-12-18 ENCOUNTER — Encounter: Payer: Self-pay | Admitting: Family Medicine

## 2019-12-18 ENCOUNTER — Other Ambulatory Visit: Payer: Self-pay

## 2019-12-18 DIAGNOSIS — E785 Hyperlipidemia, unspecified: Secondary | ICD-10-CM | POA: Insufficient documentation

## 2019-12-18 DIAGNOSIS — E7800 Pure hypercholesterolemia, unspecified: Secondary | ICD-10-CM | POA: Diagnosis not present

## 2019-12-18 DIAGNOSIS — I10 Essential (primary) hypertension: Secondary | ICD-10-CM

## 2019-12-18 MED ORDER — MELOXICAM 15 MG PO TABS: 15.0000 mg | ORAL_TABLET | Freq: Every day | ORAL | 1 refills | Status: DC

## 2019-12-18 MED ORDER — LOSARTAN POTASSIUM 50 MG PO TABS: 50.0000 mg | ORAL_TABLET | Freq: Every day | ORAL | 0 refills | Status: DC

## 2019-12-18 MED ORDER — CYCLOBENZAPRINE HCL 10 MG PO TABS: 10.0000 mg | ORAL_TABLET | Freq: Three times a day (TID) | ORAL | 1 refills | Status: DC | PRN

## 2019-12-18 NOTE — Assessment & Plan Note (Signed)
Recheck lipids, adjust as needed. Continue lifestyle modifications 

## 2019-12-18 NOTE — Assessment & Plan Note (Signed)
BPs persistently mildly above normal range. Start low dose losartan, continue to monitor home readings and continue working on lifestyle modifications. Recheck 1 month

## 2019-12-18 NOTE — Progress Notes (Signed)
BP (!) 150/84    Pulse 73    Temp 98.5 F (36.9 C) (Oral)    Wt 201 lb (91.2 kg)    SpO2 96%    BMI 30.12 kg/m    Subjective:    Patient ID: Jeffrey Hunter, male    DOB: [birth year], 59 y.o.   MRN: [id number]  HPI: Jeffrey Hunter is a 59 y.o. male  Chief Complaint  Patient presents with   Follow-up    last blood work results   Here today for 6 month f/u chronic conditions.   Checking BPs at home on occasion, notes readings typically just above normal range. Has never been diagnosed with hypertension in the past but notes a strong fhx of it. Denies CP, SOB, HAs, dizziness, blurred vision.   HLD - diet controlled. Has cut out many friend and sugary foods since last labs. Denies claudication, CP, SOB.   No new concerns today.   Relevant past medical, surgical, family and social history reviewed and updated as indicated. Interim medical history since our last visit reviewed. Allergies and medications reviewed and updated.  Review of Systems  Per HPI unless specifically indicated above     Objective:    BP (!) 150/84    Pulse 73    Temp 98.5 F (36.9 C) (Oral)    Wt 201 lb (91.2 kg)    SpO2 96%    BMI 30.12 kg/m   Wt Readings from Last 3 Encounters:  12/18/19 201 lb (91.2 kg)  06/19/19 211 lb (95.7 kg)  02/18/15 200 lb (90.7 kg)    Physical Exam Vitals and nursing note reviewed.  Constitutional:      Appearance: Normal appearance.  HENT:     Head: Atraumatic.  Eyes:     Extraocular Movements: Extraocular movements intact.     Conjunctiva/sclera: Conjunctivae normal.  Cardiovascular:     Rate and Rhythm: Normal rate and regular rhythm.  Pulmonary:     Effort: Pulmonary effort is normal.     Breath sounds: Normal breath sounds.  Musculoskeletal:        General: Normal range of motion.     Cervical back: Normal range of motion and neck supple.  Skin:    General: Skin is warm and dry.  Neurological:     General: No focal deficit present.     Mental Status:  He is oriented to person, place, and time.  Psychiatric:        Mood and Affect: Mood normal.        Thought Content: Thought content normal.        Judgment: Judgment normal.     Results for orders placed or performed in visit on 07/24/19  Cologuard  Result Value Ref Range   Cologuard Negative Negative      Assessment & Plan:   Problem List Items Addressed This Visit      Cardiovascular and Mediastinum   Essential hypertension    BPs persistently mildly above normal range. Start low dose losartan, continue to monitor home readings and continue working on lifestyle modifications. Recheck 1 month      Relevant Medications   losartan (COZAAR) 50 MG tablet     Other   Hyperlipidemia    Recheck lipids, adjust as needed. Continue lifestyle modifications       Relevant Medications   losartan (COZAAR) 50 MG tablet   Other Relevant Orders   Comprehensive metabolic panel   Lipid Panel w/o Chol/HDL Ratio  Follow up plan: Return in about 4 weeks (around 01/15/2020) for HTN.

## 2019-12-19 LAB — COMPREHENSIVE METABOLIC PANEL
ALT: 16 IU/L (ref 0–44)
AST: 15 IU/L (ref 0–40)
Albumin/Globulin Ratio: 2.3 — ABNORMAL HIGH (ref 1.2–2.2)
Albumin: 4.8 g/dL (ref 3.8–4.9)
Alkaline Phosphatase: 99 IU/L (ref 48–121)
BUN/Creatinine Ratio: 16 (ref 9–20)
BUN: 17 mg/dL (ref 6–24)
Bilirubin Total: 0.5 mg/dL (ref 0.0–1.2)
CO2: 24 mmol/L (ref 20–29)
Calcium: 9.4 mg/dL (ref 8.7–10.2)
Chloride: 102 mmol/L (ref 96–106)
Creatinine, Ser: 1.04 mg/dL (ref 0.76–1.27)
GFR calc Af Amer: 90 mL/min/{1.73_m2} (ref >59)
GFR calc non Af Amer: 78 mL/min/{1.73_m2} (ref >59)
Globulin, Total: 2.1 g/dL (ref 1.5–4.5)
Glucose: 74 mg/dL (ref 65–99)
Potassium: 4 mmol/L (ref 3.5–5.2)
Sodium: 140 mmol/L (ref 134–144)
Total Protein: 6.9 g/dL (ref 6.0–8.5)

## 2019-12-19 LAB — LIPID PANEL W/O CHOL/HDL RATIO
Cholesterol, Total: 216 mg/dL — ABNORMAL HIGH (ref 100–199)
HDL: 35 mg/dL — ABNORMAL LOW (ref >39)
LDL Chol Calc (NIH): 161 mg/dL — ABNORMAL HIGH (ref 0–99)
Triglycerides: 112 mg/dL (ref 0–149)
VLDL Cholesterol Cal: 20 mg/dL (ref 5–40)

## 2019-12-20 ENCOUNTER — Encounter: Payer: Self-pay | Admitting: Family Medicine

## 2019-12-22 ENCOUNTER — Other Ambulatory Visit: Payer: Self-pay | Admitting: Nurse Practitioner

## 2019-12-22 MED ORDER — ATORVASTATIN CALCIUM 10 MG PO TABS: 10.0000 mg | ORAL_TABLET | Freq: Every day | ORAL | 3 refills | Status: DC

## 2020-01-09 ENCOUNTER — Other Ambulatory Visit: Payer: Self-pay | Admitting: Family Medicine

## 2020-01-23 ENCOUNTER — Ambulatory Visit: Payer: Managed Care, Other (non HMO) | Admitting: Nurse Practitioner

## 2020-01-26 ENCOUNTER — Other Ambulatory Visit: Payer: Self-pay

## 2020-01-26 MED ORDER — LOSARTAN POTASSIUM 50 MG PO TABS: 50.0000 mg | ORAL_TABLET | Freq: Every day | ORAL | 1 refills | Status: DC

## 2020-01-30 ENCOUNTER — Encounter: Payer: Self-pay | Admitting: Nurse Practitioner

## 2020-01-30 ENCOUNTER — Other Ambulatory Visit: Payer: Self-pay

## 2020-01-30 ENCOUNTER — Ambulatory Visit: Payer: BC Managed Care – PPO | Admitting: Nurse Practitioner

## 2020-01-30 VITALS — BP 178/111 | HR 80 | Temp 98.7°F | Ht 68.5 in | Wt 198.0 lb

## 2020-01-30 DIAGNOSIS — I10 Essential (primary) hypertension: Secondary | ICD-10-CM

## 2020-01-30 DIAGNOSIS — J302 Other seasonal allergic rhinitis: Secondary | ICD-10-CM

## 2020-01-30 DIAGNOSIS — E7800 Pure hypercholesterolemia, unspecified: Secondary | ICD-10-CM

## 2020-01-30 MED ORDER — FLUTICASONE PROPIONATE 50 MCG/ACT NA SUSP: 2.0000 | Freq: Every day | NASAL | 6 refills | Status: DC

## 2020-01-30 MED ORDER — CETIRIZINE HCL 10 MG PO TABS: 10.0000 mg | ORAL_TABLET | Freq: Every day | ORAL | 11 refills | Status: DC

## 2020-01-30 NOTE — Progress Notes (Signed)
BP (!) 178/111 (BP Location: Right Arm, Patient Position: Sitting)   Pulse 80   Temp 98.7 F (37.1 C) (Oral)   Ht 5' 8.5" (1.74 m)   Wt 198 lb (89.8 kg)   SpO2 97%   BMI 29.67 kg/m    Subjective:    Patient ID: Jeffrey Hunter, male    DOB: 06-27-1960, 59 y.o.   MRN: [id number]  HPI: Jeffrey Hunter is a 59 y.o. male presenting for follow-up.  Chief Complaint  Patient presents with  . Hypertension    has been out of BP meds for x1 week RF was placed on 01/26/2020    HYPERTENSION Reports was taking losartan 50 mg daily for blood pressure, but ran out about 1 week ago. Hypertension status: controlled  Satisfied with current treatment? yes Duration of hypertension: chronic BP monitoring frequency:  not checking BP medication side effects:  no Medication compliance: excellent compliance Previous BP meds: losartan 50 mg Aspirin: yes Recurrent headaches: no Visual changes: no Palpitations: no Dyspnea: no Chest pain: no Lower extremity edema: no Dizzy/lightheaded: no   HYPERLIPIDEMIA Recently started atorvastatin 10 mg daily and reports is tolerating it well. Hyperlipidemia status: excellent compliance Satisfied with current treatment?  yes Side effects:  no Medication compliance: excellent compliance Past cholesterol meds: atorvastatin Supplements: none Aspirin:  yes The 10-year ASCVD risk score Denman George DC Jr., et al., 2013) is: 34.3%   Values used to calculate the score:     Age: 55 years     Sex: Male     Is Non-Hispanic African American: No     Diabetic: No     Tobacco smoker: Yes     Systolic Blood Pressure: 178 mmHg     Is BP treated: Yes     HDL Cholesterol: 35 mg/dL     Total Cholesterol: 216 mg/dL Chest pain:  no Coronary artery disease:  no Family history CAD:  no Family history early CAD:  no   UPPER RESPIRATORY TRACT INFECTION Worst symptom: drainage Fever: no Cough: no Shortness of breath: no Wheezing: no Chest pain: no Chest tightness:  no Chest congestion: no Nasal congestion: no Runny nose: yes Post nasal drip: no Sneezing: yes Sore throat: no Swollen glands: no Sinus pressure: no Headache: no Face pain: no Toothache: no Ear pain: no  Ear pressure: no  Eyes red/itching:no Eye drainage/crusting: no  Vomiting: no Rash: no Fatigue: no Sick contacts: no Strep contacts: no  Context: better Recurrent sinusitis: no Relief with OTC cold/cough medications: no  Treatments attempted: sudafed; alka seltzer cold    No Known Allergies  Outpatient Encounter Medications as of 01/30/2020  Medication Sig  . ASPIRIN 81 PO Take by mouth as needed.  Marland Kitchen atorvastatin (LIPITOR) 10 MG tablet Take 1 tablet (10 mg total) by mouth daily.  . cyclobenzaprine (FLEXERIL) 10 MG tablet Take 1 tablet (10 mg total) by mouth 3 (three) times daily as needed for muscle spasms. DO NOT DRIVE WHILE TAKING THIS MEDICINE  . losartan (COZAAR) 50 MG tablet Take 1 tablet (50 mg total) by mouth daily.  . meloxicam (MOBIC) 15 MG tablet Take 1 tablet (15 mg total) by mouth daily.  . cetirizine (ZYRTEC) 10 MG tablet Take 1 tablet (10 mg total) by mouth daily.  . fluticasone (FLONASE) 50 MCG/ACT nasal spray Place 2 sprays into both nostrils daily.   No facility-administered encounter medications on file as of 01/30/2020.   Patient Active Problem List   Diagnosis Date Noted  .  Seasonal allergic rhinitis 01/31/2020  . Hyperlipidemia 12/18/2019  . Essential hypertension 12/18/2019   History reviewed. No pertinent past medical history.  Relevant past medical, surgical, family and social history reviewed and updated as indicated. Interim medical history since our last visit reviewed. Allergies and medications reviewed and updated.  Review of Systems  Constitutional: Negative.  Negative for activity change, appetite change, diaphoresis, fatigue and fever.  HENT: Positive for rhinorrhea and sneezing. Negative for congestion, ear discharge, ear pain,  postnasal drip, sinus pressure, sinus pain, sore throat and trouble swallowing.   Eyes: Negative.  Negative for visual disturbance.  Respiratory: Negative.  Negative for cough, shortness of breath and wheezing.   Cardiovascular: Negative.  Negative for chest pain, palpitations and leg swelling.  Gastrointestinal: Negative.  Negative for abdominal pain, blood in stool, constipation, diarrhea, nausea and vomiting.  Endocrine: Negative.   Musculoskeletal: Negative.  Negative for arthralgias and myalgias.  Skin: Negative.  Negative for rash.  Neurological: Negative.  Negative for dizziness, weakness, light-headedness and headaches.  Psychiatric/Behavioral: Negative.  Negative for confusion, self-injury and sleep disturbance.    Per HPI unless specifically indicated above     Objective:    BP (!) 178/111 (BP Location: Right Arm, Patient Position: Sitting)   Pulse 80   Temp 98.7 F (37.1 C) (Oral)   Ht 5' 8.5" (1.74 m)   Wt 198 lb (89.8 kg)   SpO2 97%   BMI 29.67 kg/m   Wt Readings from Last 3 Encounters:  01/30/20 198 lb (89.8 kg)  12/18/19 201 lb (91.2 kg)  06/19/19 211 lb (95.7 kg)    Physical Exam Vitals and nursing note reviewed.  Constitutional:      General: He is in acute distress.     Appearance: Normal appearance. He is normal weight.  HENT:     Head: Normocephalic and atraumatic.     Right Ear: External ear normal.     Left Ear: External ear normal.     Nose: Congestion present.     Mouth/Throat:     Mouth: Mucous membranes are moist.     Pharynx: Oropharynx is clear. Posterior oropharyngeal erythema present.  Eyes:     General: No scleral icterus.    Extraocular Movements: Extraocular movements intact.     Pupils: Pupils are equal, round, and reactive to light.  Neck:     Vascular: No carotid bruit.  Cardiovascular:     Rate and Rhythm: Normal rate.     Heart sounds: Normal heart sounds. No murmur heard.   Pulmonary:     Effort: Pulmonary effort is normal.  No respiratory distress.     Breath sounds: Normal breath sounds. No wheezing or rhonchi.  Abdominal:     General: Abdomen is flat. Bowel sounds are normal. There is no distension.  Musculoskeletal:        General: Normal range of motion.     Cervical back: Normal range of motion.     Right lower leg: No edema.     Left lower leg: No edema.  Skin:    General: Skin is warm and dry.     Coloration: Skin is not jaundiced or pale.  Neurological:     General: No focal deficit present.     Mental Status: He is alert and oriented to person, place, and time.     Motor: No weakness.     Gait: Gait normal.  Psychiatric:        Mood and Affect: Mood normal.  Behavior: Behavior normal.        Thought Content: Thought content normal.        Judgment: Judgment normal.     Results for orders placed or performed in visit on 01/30/20  Comprehensive metabolic panel  Result Value Ref Range   Glucose 90 65 - 99 mg/dL   BUN 23 6 - 24 mg/dL   Creatinine, Ser 3.55 0.76 - 1.27 mg/dL   GFR calc non Af Amer 66 >59 mL/min/1.73   GFR calc Af Amer 77 >59 mL/min/1.73   BUN/Creatinine Ratio 19 9 - 20   Sodium 140 134 - 144 mmol/L   Potassium 4.6 3.5 - 5.2 mmol/L   Chloride 101 96 - 106 mmol/L   CO2 25 20 - 29 mmol/L   Calcium 9.6 8.7 - 10.2 mg/dL   Total Protein 6.8 6.0 - 8.5 g/dL   Albumin 4.4 3.8 - 4.9 g/dL   Globulin, Total 2.4 1.5 - 4.5 g/dL   Albumin/Globulin Ratio 1.8 1.2 - 2.2   Bilirubin Total 0.3 0.0 - 1.2 mg/dL   Alkaline Phosphatase 103 44 - 121 IU/L   AST 13 0 - 40 IU/L   ALT 15 0 - 44 IU/L      Assessment & Plan:   Problem List Items Addressed This Visit      Cardiovascular and Mediastinum   Essential hypertension    Chronic, uncontrolled today in office.  As is out of medicine, will restart losartan and monitor closely.  Patient encouraged to check blood pressure at home and notify office if remains greater than 140/90.  Goal less than 130/80.  CMP checked today.   Follow-up in 6 months or sooner if needs arise.      Relevant Orders   Comprehensive metabolic panel (Completed)     Respiratory   Seasonal allergic rhinitis    Acute, ongoing.  Likely cause of upper respiratory symptoms today.  Will start on daily Flonase and Zyrtec.  To use until symptoms resolve and then can use daily or during season changes.  Return to clinic if these medications do not help.      Relevant Medications   fluticasone (FLONASE) 50 MCG/ACT nasal spray   cetirizine (ZYRTEC) 10 MG tablet     Other   Hyperlipidemia - Primary    Acute, ongoing.  Recently started atorvastatin 10 mg and is tolerating well.  CMP checked today to check liver function.  We will continue atorvastatin 10 mg and recheck lipids in about 6 months.      Relevant Orders   Comprehensive metabolic panel (Completed)       Follow up plan: Return in about 6 months (around 07/30/2020) for BP, HLD f/u.

## 2020-01-30 NOTE — Patient Instructions (Signed)
[place], Be sure to check your blood pressure at home.  It should be less than 130/80.  If it is not when you are taking the medication, please let us know.  DASH Eating Plan DASH stands for "Dietary Approaches to Stop Hypertension." The DASH eating plan is a healthy eating plan that has been shown to reduce high blood pressure (hypertension). It may also reduce your risk for type 2 diabetes, heart disease, and stroke. The DASH eating plan may also help with weight loss. What are tips for following this plan?  General guidelines  Avoid eating more than 2,300 mg (milligrams) of salt (sodium) a day. If you have hypertension, you may need to reduce your sodium intake to 1,500 mg a day.  Limit alcohol intake to no more than 1 drink a day for nonpregnant women and 2 drinks a day for men. One drink equals 12 oz of beer, 5 oz of wine, or 1 oz of hard liquor.  Work with your health care provider to maintain a healthy body weight or to lose weight. Ask what an ideal weight is for you.  Get at least 30 minutes of exercise that causes your heart to beat faster (aerobic exercise) most days of the week. Activities may include walking, swimming, or biking.  Work with your health care provider or diet and nutrition specialist (dietitian) to adjust your eating plan to your individual calorie needs. Reading food labels   Check food labels for the amount of sodium per serving. Choose foods with less than 5 percent of the Daily Value of sodium. Generally, foods with less than 300 mg of sodium per serving fit into this eating plan.  To find whole grains, look for the word "whole" as the first word in the ingredient list. Shopping  Buy products labeled as "low-sodium" or "no salt added."  Buy fresh foods. Avoid canned foods and premade or frozen meals. Cooking  Avoid adding salt when cooking. Use salt-free seasonings or herbs instead of table salt or sea salt. Check with your health care provider or  pharmacist before using salt substitutes.  Do not fry foods. Cook foods using healthy methods such as baking, boiling, grilling, and broiling instead.  Cook with heart-healthy oils, such as olive, canola, soybean, or sunflower oil. Meal planning  Eat a balanced diet that includes: ? 5 or more servings of fruits and vegetables each day. At each meal, try to fill half of your plate with fruits and vegetables. ? Up to 6-8 servings of whole grains each day. ? Less than 6 oz of lean meat, poultry, or fish each day. A 3-oz serving of meat is about the same size as a deck of cards. One egg equals 1 oz. ? 2 servings of low-fat dairy each day. ? A serving of nuts, seeds, or beans 5 times each week. ? Heart-healthy fats. Healthy fats called Omega-3 fatty acids are found in foods such as flaxseeds and coldwater fish, like sardines, salmon, and mackerel.  Limit how much you eat of the following: ? Canned or prepackaged foods. ? Food that is high in trans fat, such as fried foods. ? Food that is high in saturated fat, such as fatty meat. ? Sweets, desserts, sugary drinks, and other foods with added sugar. ? Full-fat dairy products.  Do not salt foods before eating.  Try to eat at least 2 vegetarian meals each week.  Eat more home-cooked food and less restaurant, buffet, and fast food.  When eating at a  restaurant, ask that your food be prepared with less salt or no salt, if possible. What foods are recommended? The items listed may not be a complete list. Talk with your dietitian about what dietary choices are best for you. Grains Whole-grain or whole-wheat bread. Whole-grain or whole-wheat pasta. Brown rice. [place]. Bulgur. Whole-grain and low-sodium cereals. Pita bread. Low-fat, low-sodium crackers. Whole-wheat flour tortillas. Vegetables Fresh or frozen vegetables (raw, steamed, roasted, or grilled). Low-sodium or reduced-sodium tomato and vegetable juice. Low-sodium or  reduced-sodium tomato sauce and tomato paste. Low-sodium or reduced-sodium canned vegetables. Fruits All fresh, dried, or frozen fruit. Canned fruit in natural juice (without added sugar). Meat and other protein foods Skinless chicken or [place]. Ground chicken or [place]. Pork with fat trimmed off. Fish and seafood. Egg whites. Dried beans, peas, or lentils. Unsalted nuts, nut butters, and seeds. Unsalted canned beans. Lean cuts of beef with fat trimmed off. Low-sodium, lean deli meat. Dairy Low-fat (1%) or fat-free (skim) milk. Fat-free, low-fat, or reduced-fat cheeses. Nonfat, low-sodium ricotta or cottage cheese. Low-fat or nonfat yogurt. Low-fat, low-sodium cheese. Fats and oils Soft margarine without trans fats. Vegetable oil. Low-fat, reduced-fat, or light mayonnaise and salad dressings (reduced-sodium). Canola, safflower, olive, soybean, and sunflower oils. Avocado. Seasoning and other foods Herbs. Spices. Seasoning mixes without salt. Unsalted popcorn and pretzels. Fat-free sweets. What foods are not recommended? The items listed may not be a complete list. Talk with your dietitian about what dietary choices are best for you. Grains Baked goods made with fat, such as croissants, muffins, or some breads. Dry pasta or rice meal packs. Vegetables Creamed or fried vegetables. Vegetables in a cheese sauce. Regular canned vegetables (not low-sodium or reduced-sodium). Regular canned tomato sauce and paste (not low-sodium or reduced-sodium). Regular tomato and vegetable juice (not low-sodium or reduced-sodium). Angie Fava. Olives. Fruits Canned fruit in a light or heavy syrup. Fried fruit. Fruit in cream or butter sauce. Meat and other protein foods Fatty cuts of meat. Ribs. Fried meat. Berniece Salines. Sausage. Bologna and other processed lunch meats. Salami. Fatback. Hotdogs. Bratwurst. Salted nuts and seeds. Canned beans with added salt. Canned or smoked fish. Whole eggs or egg yolks. Chicken or [place]  with skin. Dairy Whole or 2% milk, cream, and half-and-half. Whole or full-fat cream cheese. Whole-fat or sweetened yogurt. Full-fat cheese. Nondairy creamers. Whipped toppings. Processed cheese and cheese spreads. Fats and oils Butter. Stick margarine. Lard. Shortening. Ghee. Bacon fat. Tropical oils, such as coconut, palm kernel, or palm oil. Seasoning and other foods Salted popcorn and pretzels. Onion salt, garlic salt, seasoned salt, table salt, and sea salt. Worcestershire sauce. Tartar sauce. Barbecue sauce. Teriyaki sauce. Soy sauce, including reduced-sodium. Steak sauce. Canned and packaged gravies. Fish sauce. Oyster sauce. Cocktail sauce. Horseradish that you find on the shelf. Ketchup. Mustard. Meat flavorings and tenderizers. Bouillon cubes. Hot sauce and Tabasco sauce. Premade or packaged marinades. Premade or packaged taco seasonings. Relishes. Regular salad dressings. Where to find more information:  National Heart, Lung, and [place]: https://wilson-eaton.com/  American Heart Association: www.heart.org Summary  The DASH eating plan is a healthy eating plan that has been shown to reduce high blood pressure (hypertension). It may also reduce your risk for type 2 diabetes, heart disease, and stroke.  With the DASH eating plan, you should limit salt (sodium) intake to 2,300 mg a day. If you have hypertension, you may need to reduce your sodium intake to 1,500 mg a day.  When on the DASH eating plan, aim to eat more  fresh fruits and vegetables, whole grains, lean proteins, low-fat dairy, and heart-healthy fats.  Work with your health care provider or diet and nutrition specialist (dietitian) to adjust your eating plan to your individual calorie needs. This information is not intended to replace advice given to you by your health care provider. Make sure you discuss any questions you have with your health care provider. Document Revised: 03/23/2017 Document Reviewed:  04/03/2016 Elsevier Patient Education  2020 [employer].

## 2020-01-31 DIAGNOSIS — J302 Other seasonal allergic rhinitis: Secondary | ICD-10-CM | POA: Insufficient documentation

## 2020-01-31 LAB — COMPREHENSIVE METABOLIC PANEL
ALT: 15 IU/L (ref 0–44)
AST: 13 IU/L (ref 0–40)
Albumin/Globulin Ratio: 1.8 (ref 1.2–2.2)
Albumin: 4.4 g/dL (ref 3.8–4.9)
Alkaline Phosphatase: 103 IU/L (ref 44–121)
BUN/Creatinine Ratio: 19 (ref 9–20)
BUN: 23 mg/dL (ref 6–24)
Bilirubin Total: 0.3 mg/dL (ref 0.0–1.2)
CO2: 25 mmol/L (ref 20–29)
Calcium: 9.6 mg/dL (ref 8.7–10.2)
Chloride: 101 mmol/L (ref 96–106)
Creatinine, Ser: 1.19 mg/dL (ref 0.76–1.27)
GFR calc Af Amer: 77 mL/min/{1.73_m2} (ref >59)
GFR calc non Af Amer: 66 mL/min/{1.73_m2} (ref >59)
Globulin, Total: 2.4 g/dL (ref 1.5–4.5)
Glucose: 90 mg/dL (ref 65–99)
Potassium: 4.6 mmol/L (ref 3.5–5.2)
Sodium: 140 mmol/L (ref 134–144)
Total Protein: 6.8 g/dL (ref 6.0–8.5)

## 2020-01-31 NOTE — Assessment & Plan Note (Signed)
Acute, ongoing.  Likely cause of upper respiratory symptoms today.  Will start on daily Flonase and Zyrtec.  To use until symptoms resolve and then can use daily or during season changes.  Return to clinic if these medications do not help.

## 2020-01-31 NOTE — Assessment & Plan Note (Signed)
Chronic, uncontrolled today in office.  As is out of medicine, will restart losartan and monitor closely.  Patient encouraged to check blood pressure at home and notify office if remains greater than 140/90.  Goal less than 130/80.  CMP checked today.  Follow-up in 6 months or sooner if needs arise.

## 2020-01-31 NOTE — Assessment & Plan Note (Signed)
Acute, ongoing.  Recently started atorvastatin 10 mg and is tolerating well.  CMP checked today to check liver function.  We will continue atorvastatin 10 mg and recheck lipids in about 6 months.

## 2020-02-16 ENCOUNTER — Encounter: Payer: Self-pay | Admitting: Nurse Practitioner

## 2020-03-04 ENCOUNTER — Encounter: Payer: Self-pay | Admitting: Nurse Practitioner

## 2020-03-12 ENCOUNTER — Ambulatory Visit: Payer: BC Managed Care – PPO | Admitting: Nurse Practitioner

## 2020-03-12 ENCOUNTER — Other Ambulatory Visit: Payer: Self-pay

## 2020-03-12 ENCOUNTER — Encounter: Payer: Self-pay | Admitting: Nurse Practitioner

## 2020-03-12 VITALS — BP 157/83 | HR 76 | Temp 98.5°F | Wt 202.8 lb

## 2020-03-12 DIAGNOSIS — Z716 Tobacco abuse counseling: Secondary | ICD-10-CM

## 2020-03-12 DIAGNOSIS — I10 Essential (primary) hypertension: Secondary | ICD-10-CM

## 2020-03-12 MED ORDER — LOSARTAN POTASSIUM-HCTZ 100-12.5 MG PO TABS: 1.0000 | ORAL_TABLET | Freq: Every day | ORAL | 0 refills | Status: DC

## 2020-03-12 NOTE — Progress Notes (Signed)
BP (!) 157/83 (Cuff Size: Normal)   Pulse 76   Temp 98.5 F (36.9 C) (Oral)   Wt 202 lb 12.8 oz (92 kg)   SpO2 97%   BMI 30.39 kg/m    Subjective:    Patient ID: Jeffrey Hunter, male    DOB: 1961-04-10, 59 y.o.   MRN: [id number]  HPI: Jeffrey Hunter is a 59 y.o. male presenting for blood pressure follow up.  Chief Complaint  Patient presents with  . Hypertension    pt states he has been getting eleavted readings at home, uses wrist cuff to check BP at home. States he has been taking 2 Losartan daily for the past week (needs new RX for this)   HYPERTENSION Currently taking losartan 100 mg.  Frustrated that blood pressure readings at home have not decreased and that wrist cuff does not match with readings while in the office.  Of note, does smoke cigarettes and did smoke a cigarette immediately prior to coming in for appointment today. Hypertension status: uncontrolled  Satisfied with current treatment? no Duration of hypertension: chronic BP monitoring frequency:  daily BP range: 180s/100s BP medication side effects:  no Medication compliance: excellent Aspirin: no Recurrent headaches: no Visual changes: no Palpitations: no Dyspnea: no Chest pain: no Lower extremity edema: no Dizzy/lightheaded: no   SMOKING CESSATION Smoking Status: current smoker Smoking Amount: 1/2 ppd ~ 10 cigarettes Smoking Onset: in 20's Smoking Quit Date: TBD Smoking triggers: stress, social, wife smokes Type of tobacco use:  cigarettes Children in the house: no Other household members who smoke: yes Treatments attempted: nothing Pneumovax: Not Up to date  No Known Allergies  Outpatient Encounter Medications as of 03/12/2020  Medication Sig  . ASPIRIN 81 PO Take by mouth as needed.  Marland Kitchen atorvastatin (LIPITOR) 10 MG tablet Take 1 tablet (10 mg total) by mouth daily.  . cetirizine (ZYRTEC) 10 MG tablet Take 1 tablet (10 mg total) by mouth daily.  . cyclobenzaprine (FLEXERIL) 10 MG  tablet Take 1 tablet (10 mg total) by mouth 3 (three) times daily as needed for muscle spasms. DO NOT DRIVE WHILE TAKING THIS MEDICINE  . fluticasone (FLONASE) 50 MCG/ACT nasal spray Place 2 sprays into both nostrils daily.  . meloxicam (MOBIC) 15 MG tablet Take 1 tablet (15 mg total) by mouth daily.  . [DISCONTINUED] losartan (COZAAR) 50 MG tablet Take 1 tablet (50 mg total) by mouth daily. (Patient taking differently: Take 100 mg by mouth daily. )  . losartan-hydrochlorothiazide (HYZAAR) 100-12.5 MG tablet Take 1 tablet by mouth daily.   No facility-administered encounter medications on file as of 03/12/2020.   Patient Active Problem List   Diagnosis Date Noted  . Encounter for smoking cessation counseling 03/14/2020  . Seasonal allergic rhinitis 01/31/2020  . Hyperlipidemia 12/18/2019  . Essential hypertension 12/18/2019   History reviewed. No pertinent past medical history.  Relevant past medical, surgical, family and social history reviewed and updated as indicated. Interim medical history since our last visit reviewed.  Review of Systems  Constitutional: Negative.  Negative for activity change, fatigue and fever.  Eyes: Negative.  Negative for visual disturbance.  Respiratory: Negative.  Negative for cough, chest tightness, shortness of breath and wheezing.   Cardiovascular: Negative.  Negative for chest pain, palpitations and leg swelling.  Skin: Negative.   Neurological: Negative.  Negative for dizziness, weakness, light-headedness and headaches.  Psychiatric/Behavioral: Negative.     Per HPI unless specifically indicated above     Objective:  BP (!) 157/83 (Cuff Size: Normal)   Pulse 76   Temp 98.5 F (36.9 C) (Oral)   Wt 202 lb 12.8 oz (92 kg)   SpO2 97%   BMI 30.39 kg/m   Wt Readings from Last 3 Encounters:  03/12/20 202 lb 12.8 oz (92 kg)  01/30/20 198 lb (89.8 kg)  12/18/19 201 lb (91.2 kg)    Physical Exam Vitals and nursing note reviewed.    Constitutional:      General: He is not in acute distress.    Appearance: Normal appearance. He is normal weight. He is not toxic-appearing.  HENT:     Head: Normocephalic and atraumatic.  Eyes:     General: No scleral icterus.    Extraocular Movements: Extraocular movements intact.     Pupils: Pupils are equal, round, and reactive to light.  Cardiovascular:     Rate and Rhythm: Normal rate. Rhythm irregular.     Heart sounds: Normal heart sounds. No murmur heard.   Pulmonary:     Effort: Pulmonary effort is normal. No respiratory distress.     Breath sounds: No stridor. Wheezing present. No rhonchi.  Musculoskeletal:     Cervical back: Normal range of motion. No rigidity.  Skin:    General: Skin is warm and dry.     Coloration: Skin is not jaundiced or pale.     Findings: No erythema.  Neurological:     General: No focal deficit present.     Mental Status: He is alert and oriented to person, place, and time.     Motor: No weakness.     Gait: Gait normal.  Psychiatric:        Mood and Affect: Mood normal.        Behavior: Behavior normal.        Thought Content: Thought content normal.        Judgment: Judgment normal.        Assessment & Plan:   Problem List Items Addressed This Visit      Cardiovascular and Mediastinum   Essential hypertension - Primary    Chronic, uncontrolled.  Most likely elevated blood pressures at home are due to using wrist cuff that is inaccurate.  Encouraged patient to start using arm cuff.  In meantime, start combination losartan 100 mg-hydrochlorothiazide 12.5 mg.  Continue to closely monitor blood pressure at home.  Goal less than 130/80.  If blood pressure less than 100/60 at home and patient symptomatic, return to clinic sooner.  Monitor closely at home for dizziness, lightheadedness, vision changes, palpitations, shortness of breath.  BMET today. follow-up in 1 month.      Relevant Medications   losartan-hydrochlorothiazide (HYZAAR)  100-12.5 MG tablet   Other Relevant Orders   Basic Metabolic Panel (BMET) (Completed)     Other   Encounter for smoking cessation counseling    Currently smoking about 10 cigarettes/day.  Discussed cutting down to quit method with picking a quit date and counting back 10 weeks.  Eliminate 1 cigarette/week and replace the cigarette with alternative like chewing gum, or sucker.  If desires nicotine replacement like patch, to let us know.          Follow up plan: Return in about 4 weeks (around 04/09/2020) for HTN f/u and smoking cessation.

## 2020-03-13 LAB — BASIC METABOLIC PANEL
BUN/Creatinine Ratio: 20 (ref 9–20)
BUN: 23 mg/dL (ref 6–24)
CO2: 21 mmol/L (ref 20–29)
Calcium: 9.7 mg/dL (ref 8.7–10.2)
Chloride: 108 mmol/L — ABNORMAL HIGH (ref 96–106)
Creatinine, Ser: 1.13 mg/dL (ref 0.76–1.27)
GFR calc Af Amer: 82 mL/min/{1.73_m2} (ref >59)
GFR calc non Af Amer: 71 mL/min/{1.73_m2} (ref >59)
Glucose: 93 mg/dL (ref 65–99)
Potassium: 4.5 mmol/L (ref 3.5–5.2)
Sodium: 146 mmol/L — ABNORMAL HIGH (ref 134–144)

## 2020-03-14 ENCOUNTER — Encounter: Payer: Self-pay | Admitting: Nurse Practitioner

## 2020-03-14 DIAGNOSIS — Z716 Tobacco abuse counseling: Secondary | ICD-10-CM | POA: Insufficient documentation

## 2020-03-14 DIAGNOSIS — F17210 Nicotine dependence, cigarettes, uncomplicated: Secondary | ICD-10-CM | POA: Insufficient documentation

## 2020-03-14 NOTE — Assessment & Plan Note (Signed)
Chronic, uncontrolled.  Most likely elevated blood pressures at home are due to using wrist cuff that is inaccurate.  Encouraged patient to start using arm cuff.  In meantime, start combination losartan 100 mg-hydrochlorothiazide 12.5 mg.  Continue to closely monitor blood pressure at home.  Goal less than 130/80.  If blood pressure less than 100/60 at home and patient symptomatic, return to clinic sooner.  Monitor closely at home for dizziness, lightheadedness, vision changes, palpitations, shortness of breath.  BMET today. follow-up in 1 month.

## 2020-03-14 NOTE — Assessment & Plan Note (Signed)
Currently smoking about 10 cigarettes/day.  Discussed cutting down to quit method with picking a quit date and counting back 10 weeks.  Eliminate 1 cigarette/week and replace the cigarette with alternative like chewing gum, or sucker.  If desires nicotine replacement like patch, to let us know.

## 2020-03-14 NOTE — Patient Instructions (Signed)
Steps to Quit Smoking Smoking tobacco is the leading cause of preventable death. It can affect almost every organ in the body. Smoking puts you and people around you at risk for many serious, long-lasting (chronic) diseases. Quitting smoking can be hard, but it is one of the best things that you can do for your health. It is never too late to quit. How do I get ready to quit? When you decide to quit smoking, make a plan to help you succeed. Before you quit:  Pick a date to quit. Set a date within the next 2 weeks to give you time to prepare.  Write down the reasons why you are quitting. Keep this list in places where you will see it often.  Tell your family, friends, and co-workers that you are quitting. Their support is important.  Talk with your doctor about the choices that may help you quit.  Find out if your health insurance will pay for these treatments.  Know the people, places, things, and activities that make you want to smoke (triggers). Avoid them. What first steps can I take to quit smoking?  Throw away all cigarettes at home, at work, and in your car.  Throw away the things that you use when you smoke, such as ashtrays and lighters.  Clean your car. Make sure to empty the ashtray.  Clean your home, including curtains and carpets. What can I do to help me quit smoking? Talk with your doctor about taking medicines and seeing a counselor at the same time. You are more likely to succeed when you do both.  If you are pregnant or breastfeeding, talk with your doctor about counseling or other ways to quit smoking. Do not take medicine to help you quit smoking unless your doctor tells you to do so. To quit smoking: Quit right away  Quit smoking totally, instead of slowly cutting back on how much you smoke over a period of time.  Go to counseling. You are more likely to quit if you go to counseling sessions regularly. Take medicine You may take medicines to help you quit. Some  medicines need a prescription, and some you can buy over-the-counter. Some medicines may contain a drug called nicotine to replace the nicotine in cigarettes. Medicines may:  Help you to stop having the desire to smoke (cravings).  Help to stop the problems that come when you stop smoking (withdrawal symptoms). Your doctor may ask you to use:  Nicotine patches, gum, or lozenges.  Nicotine inhalers or sprays.  Non-nicotine medicine that is taken by mouth. Find resources Find resources and other ways to help you quit smoking and remain smoke-free after you quit. These resources are most helpful when you use them often. They include:  Online chats with a counselor.  Phone quitlines.  Printed self-help materials.  Support groups or group counseling.  Text messaging programs.  Mobile phone apps. Use apps on your mobile phone or tablet that can help you stick to your quit plan. There are many free apps for mobile phones and tablets as well as websites. Examples include Quit Guide from the CDC and smokefree.gov  What things can I do to make it easier to quit?   Talk to your family and friends. Ask them to support and encourage you.  Call a phone quitline (1-800-QUIT-NOW), reach out to support groups, or work with a counselor.  Ask people who smoke to not smoke around you.  Avoid places that make you want to smoke,   such as: ? Bars. ? Parties. ? Smoke-break areas at work.  Spend time with people who do not smoke.  Lower the stress in your life. Stress can make you want to smoke. Try these things to help your stress: ? Getting regular exercise. ? Doing deep-breathing exercises. ? Doing yoga. ? Meditating. ? Doing a body scan. To do this, close your eyes, focus on one area of your body at a time from head to toe. Notice which parts of your body are tense. Try to relax the muscles in those areas. How will I feel when I quit smoking? Day 1 to 3 weeks Within the first 24 hours,  you may start to have some problems that come from quitting tobacco. These problems are very bad 2-3 days after you quit, but they do not often last for more than 2-3 weeks. You may get these symptoms:  Mood swings.  Feeling restless, nervous, angry, or annoyed.  Trouble concentrating.  Dizziness.  Strong desire for high-sugar foods and nicotine.  Weight gain.  Trouble pooping (constipation).  Feeling like you may vomit (nausea).  Coughing or a sore throat.  Changes in how the medicines that you take for other issues work in your body.  Depression.  Trouble sleeping (insomnia). Week 3 and afterward After the first 2-3 weeks of quitting, you may start to notice more positive results, such as:  Better sense of smell and taste.  Less coughing and sore throat.  Slower heart rate.  Lower blood pressure.  Clearer skin.  Better breathing.  Fewer sick days. Quitting smoking can be hard. Do not give up if you fail the first time. Some people need to try a few times before they succeed. Do your best to stick to your quit plan, and talk with your doctor if you have any questions or concerns. Summary  Smoking tobacco is the leading cause of preventable death. Quitting smoking can be hard, but it is one of the best things that you can do for your health.  When you decide to quit smoking, make a plan to help you succeed.  Quit smoking right away, not slowly over a period of time.  When you start quitting, seek help from your doctor, family, or friends. This information is not intended to replace advice given to you by your health care provider. Make sure you discuss any questions you have with your health care provider. Document Revised: 01/03/2019 Document Reviewed: 06/29/2018 Elsevier Patient Education  2020 Elsevier Inc.  

## 2020-04-05 ENCOUNTER — Other Ambulatory Visit: Payer: Self-pay

## 2020-04-05 NOTE — Telephone Encounter (Signed)
Called patient to confirm that he needs refill of Losartan/HCTXZ 10-12.5 mg tab QD . Asked if he had enough to last to next appt on 12/27, and to return call.

## 2020-04-07 MED ORDER — LOSARTAN POTASSIUM-HCTZ 100-12.5 MG PO TABS: 1.0000 | ORAL_TABLET | Freq: Every day | ORAL | 0 refills | Status: DC

## 2020-04-07 MED ORDER — CYCLOBENZAPRINE HCL 10 MG PO TABS: 10.0000 mg | ORAL_TABLET | Freq: Three times a day (TID) | ORAL | 1 refills | Status: DC | PRN

## 2020-04-07 NOTE — Addendum Note (Signed)
Addended by: Pablo Ledger on: 04/07/2020 09:59 AM   Modules accepted: Orders

## 2020-04-07 NOTE — Telephone Encounter (Signed)
Pt calling again regarding this medication, Losartan and Cyclobenzaprine. He is requesting to have enough to last him until his next appt. Please advise .   [id number] - [place], [place] - [address]  [address] [place] [address]  Phone: [phone] Fax: [phone]  Hours: Not open 24 hours

## 2020-04-07 NOTE — Telephone Encounter (Signed)
Routing to provider to review.

## 2020-04-17 ENCOUNTER — Encounter: Payer: Self-pay | Admitting: Nurse Practitioner

## 2020-04-19 ENCOUNTER — Ambulatory Visit: Payer: BC Managed Care – PPO | Admitting: Nurse Practitioner

## 2020-04-19 ENCOUNTER — Other Ambulatory Visit: Payer: Self-pay

## 2020-04-19 ENCOUNTER — Encounter: Payer: Self-pay | Admitting: Nurse Practitioner

## 2020-04-19 VITALS — BP 134/88 | HR 85 | Temp 98.3°F | Wt 202.4 lb

## 2020-04-19 DIAGNOSIS — M545 Low back pain, unspecified: Secondary | ICD-10-CM | POA: Diagnosis not present

## 2020-04-19 DIAGNOSIS — E7800 Pure hypercholesterolemia, unspecified: Secondary | ICD-10-CM | POA: Diagnosis not present

## 2020-04-19 DIAGNOSIS — F17210 Nicotine dependence, cigarettes, uncomplicated: Secondary | ICD-10-CM | POA: Diagnosis not present

## 2020-04-19 DIAGNOSIS — G8929 Other chronic pain: Secondary | ICD-10-CM

## 2020-04-19 DIAGNOSIS — I10 Essential (primary) hypertension: Secondary | ICD-10-CM | POA: Diagnosis not present

## 2020-04-19 DIAGNOSIS — Z6830 Body mass index (BMI) 30.0-30.9, adult: Secondary | ICD-10-CM

## 2020-04-19 DIAGNOSIS — E669 Obesity, unspecified: Secondary | ICD-10-CM | POA: Insufficient documentation

## 2020-04-19 DIAGNOSIS — E6609 Other obesity due to excess calories: Secondary | ICD-10-CM

## 2020-04-19 MED ORDER — CYCLOBENZAPRINE HCL 10 MG PO TABS: 10.0000 mg | ORAL_TABLET | Freq: Three times a day (TID) | ORAL | 4 refills | Status: DC | PRN

## 2020-04-19 MED ORDER — ATORVASTATIN CALCIUM 10 MG PO TABS: 10.0000 mg | ORAL_TABLET | Freq: Every day | ORAL | 4 refills | Status: DC

## 2020-04-19 MED ORDER — LOSARTAN POTASSIUM-HCTZ 100-12.5 MG PO TABS: 1.0000 | ORAL_TABLET | Freq: Every day | ORAL | 4 refills | Status: DC

## 2020-04-19 NOTE — Assessment & Plan Note (Signed)
Chronic, ongoing.  Refills on Flexeril sent per patient request.  If worsening pain may benefit from imaging and ortho referral.

## 2020-04-19 NOTE — Progress Notes (Signed)
BP 134/88    Pulse 85    Temp 98.3 F (36.8 C)    Wt 202 lb 6 oz (91.8 kg)    SpO2 95%    BMI 30.32 kg/m    Subjective:    Patient ID: Jeffrey Hunter, male    DOB: 1961/01/05, 59 y.o.   MRN: [id number]  HPI: Jeffrey Hunter is a 59 y.o. male  Chief Complaint  Patient presents with   Hypertension    Needs a refill on BP meds   Nicotine Dependence   Spasms    Refill on flexeril   HYPERTENSION / HYPERLIPIDEMIA Continues on Atorvastatin for HLD and Losartan-HCTZ 100/12.5 MG for BP control.  Last visit had labs showing stable kidney function.    Takes Flexeril for spasms + Meloxicam -- has been on for long while for spasms to back, had injury years ago.  Continues to smoke, is working on cutting back.  Was 19 when he started smoking, smokes less than 1/2 PPD.   Satisfied with current treatment? yes Duration of hypertension: chronic BP monitoring frequency: daily BP range:  130/80 range on average BP medication side effects: no Duration of hyperlipidemia: chronic Cholesterol medication side effects: no Cholesterol supplements: none Medication compliance: good compliance Aspirin: yes Recent stressors: no Recurrent headaches: no Visual changes: no Palpitations: no Dyspnea: no Chest pain: no Lower extremity edema: no Dizzy/lightheaded: no  Relevant past medical, surgical, family and social history reviewed and updated as indicated. Interim medical history since our last visit reviewed. Allergies and medications reviewed and updated.  Review of Systems  Constitutional: Negative for activity change, diaphoresis, fatigue and fever.  Respiratory: Negative for cough, chest tightness, shortness of breath and wheezing.   Cardiovascular: Negative for chest pain, palpitations and leg swelling.  Gastrointestinal: Negative.   Neurological: Negative.   Psychiatric/Behavioral: Negative.     Per HPI unless specifically indicated above     Objective:    BP 134/88    Pulse  85    Temp 98.3 F (36.8 C)    Wt 202 lb 6 oz (91.8 kg)    SpO2 95%    BMI 30.32 kg/m   Wt Readings from Last 3 Encounters:  04/19/20 202 lb 6 oz (91.8 kg)  03/12/20 202 lb 12.8 oz (92 kg)  01/30/20 198 lb (89.8 kg)    Physical Exam Vitals and nursing note reviewed.  Constitutional:      General: He is awake. He is not in acute distress.    Appearance: He is well-developed and well-groomed. He is obese. He is not ill-appearing.  HENT:     Head: Normocephalic and atraumatic.     Right Ear: Hearing normal. No drainage.     Left Ear: Hearing normal. No drainage.  Eyes:     General: Lids are normal.        Right eye: No discharge.        Left eye: No discharge.     Conjunctiva/sclera: Conjunctivae normal.     Pupils: Pupils are equal, round, and reactive to light.  Neck:     Thyroid: No thyromegaly.     Vascular: No carotid bruit.     Trachea: Trachea normal.  Cardiovascular:     Rate and Rhythm: Normal rate and regular rhythm.     Heart sounds: Normal heart sounds, S1 normal and S2 normal. No murmur heard. No gallop.   Pulmonary:     Effort: Pulmonary effort is normal. No accessory muscle usage  or respiratory distress.     Breath sounds: Normal breath sounds.  Abdominal:     General: Bowel sounds are normal.     Palpations: Abdomen is soft. There is no hepatomegaly or splenomegaly.  Musculoskeletal:        General: Normal range of motion.     Cervical back: Normal range of motion and neck supple.     Right lower leg: No edema.     Left lower leg: No edema.  Skin:    General: Skin is warm and dry.     Capillary Refill: Capillary refill takes less than 2 seconds.  Neurological:     Mental Status: He is alert and oriented to person, place, and time.     Deep Tendon Reflexes: Reflexes are normal and symmetric.  Psychiatric:        Attention and Perception: Attention normal.        Mood and Affect: Mood normal.        Speech: Speech normal.        Behavior: Behavior  normal. Behavior is cooperative.        Thought Content: Thought content normal.    Results for orders placed or performed in visit on 03/12/20  Basic Metabolic Panel (BMET)  Result Value Ref Range   Glucose 93 65 - 99 mg/dL   BUN 23 6 - 24 mg/dL   Creatinine, Ser 3.26 0.76 - 1.27 mg/dL   GFR calc non Af Amer 71 >59 mL/min/1.73   GFR calc Af Amer 82 >59 mL/min/1.73   BUN/Creatinine Ratio 20 9 - 20   Sodium 146 (H) 134 - 144 mmol/L   Potassium 4.5 3.5 - 5.2 mmol/L   Chloride 108 (H) 96 - 106 mmol/L   CO2 21 20 - 29 mmol/L   Calcium 9.7 8.7 - 10.2 mg/dL      Assessment & Plan:   Problem List Items Addressed This Visit      Cardiovascular and Mediastinum   Essential hypertension - Primary    Chronic, ongoing with improvement with change in medications recent visit.  Continue Losartan-HCTZ, refills as needed, and adjust as needed. Refuses labs today, plan to recheck next visit.  Recommend he monitor BP at least a few mornings a week at home and document.  DASH diet at home.  Return in 6 months for physical.       Relevant Medications   losartan-hydrochlorothiazide (HYZAAR) 100-12.5 MG tablet   atorvastatin (LIPITOR) 10 MG tablet     Other   Hyperlipidemia    Chronic, ongoing.  Continue current medication regimen and adjust as needed.  Lipid panel next visit.      Relevant Medications   losartan-hydrochlorothiazide (HYZAAR) 100-12.5 MG tablet   atorvastatin (LIPITOR) 10 MG tablet   Nicotine dependence, cigarettes, uncomplicated    Is working on cutting back.  I have recommended complete cessation of tobacco use. I have discussed various options available for assistance with tobacco cessation including over the counter methods (Nicotine gum, patch and lozenges). We also discussed prescription options (Chantix, Nicotine Inhaler / Nasal Spray). The patient is not interested in pursuing any prescription tobacco cessation options at this time.       Chronic low back pain     Chronic, ongoing.  Refills on Flexeril sent per patient request.  If worsening pain may benefit from imaging and ortho referral.      Relevant Medications   cyclobenzaprine (FLEXERIL) 10 MG tablet   Obesity    Recommended  eating smaller high protein, low fat meals more frequently and exercising 30 mins a day 5 times a week with a goal of 10-15lb weight loss in the next 3 months. Patient voiced their understanding and motivation to adhere to these recommendations.           Follow up plan: Return in about 6 months (around 10/18/2020) for Annual physical ---- with new PCP.

## 2020-04-19 NOTE — Patient Instructions (Signed)
DASH Eating Plan DASH stands for "Dietary Approaches to Stop Hypertension." The DASH eating plan is a healthy eating plan that has been shown to reduce high blood pressure (hypertension). It may also reduce your risk for type 2 diabetes, heart disease, and stroke. The DASH eating plan may also help with weight loss. What are tips for following this plan?  General guidelines  Avoid eating more than 2,300 mg (milligrams) of salt (sodium) a day. If you have hypertension, you may need to reduce your sodium intake to 1,500 mg a day.  Limit alcohol intake to no more than 1 drink a day for nonpregnant women and 2 drinks a day for men. One drink equals 12 oz of beer, 5 oz of wine, or 1 oz of hard liquor.  Work with your health care provider to maintain a healthy body weight or to lose weight. Ask what an ideal weight is for you.  Get at least 30 minutes of exercise that causes your heart to beat faster (aerobic exercise) most days of the week. Activities may include walking, swimming, or biking.  Work with your health care provider or diet and nutrition specialist (dietitian) to adjust your eating plan to your individual calorie needs. Reading food labels   Check food labels for the amount of sodium per serving. Choose foods with less than 5 percent of the Daily Value of sodium. Generally, foods with less than 300 mg of sodium per serving fit into this eating plan.  To find whole grains, look for the word "whole" as the first word in the ingredient list. Shopping  Buy products labeled as "low-sodium" or "no salt added."  Buy fresh foods. Avoid canned foods and premade or frozen meals. Cooking  Avoid adding salt when cooking. Use salt-free seasonings or herbs instead of table salt or sea salt. Check with your health care provider or pharmacist before using salt substitutes.  Do not fry foods. Cook foods using healthy methods such as baking, boiling, grilling, and broiling instead.  Cook with  heart-healthy oils, such as olive, canola, soybean, or sunflower oil. Meal planning  Eat a balanced diet that includes: ? 5 or more servings of fruits and vegetables each day. At each meal, try to fill half of your plate with fruits and vegetables. ? Up to 6-8 servings of whole grains each day. ? Less than 6 oz of lean meat, poultry, or fish each day. A 3-oz serving of meat is about the same size as a deck of cards. One egg equals 1 oz. ? 2 servings of low-fat dairy each day. ? A serving of nuts, seeds, or beans 5 times each week. ? Heart-healthy fats. Healthy fats called Omega-3 fatty acids are found in foods such as flaxseeds and coldwater fish, like sardines, salmon, and mackerel.  Limit how much you eat of the following: ? Canned or prepackaged foods. ? Food that is high in trans fat, such as fried foods. ? Food that is high in saturated fat, such as fatty meat. ? Sweets, desserts, sugary drinks, and other foods with added sugar. ? Full-fat dairy products.  Do not salt foods before eating.  Try to eat at least 2 vegetarian meals each week.  Eat more home-cooked food and less restaurant, buffet, and fast food.  When eating at a restaurant, ask that your food be prepared with less salt or no salt, if possible. What foods are recommended? The items listed may not be a complete list. Talk with your dietitian about   what dietary choices are best for you. Grains Whole-grain or whole-wheat bread. Whole-grain or whole-wheat pasta. Brown rice. Oatmeal. Quinoa. Bulgur. Whole-grain and low-sodium cereals. Pita bread. Low-fat, low-sodium crackers. Whole-wheat flour tortillas. Vegetables Fresh or frozen vegetables (raw, steamed, roasted, or grilled). Low-sodium or reduced-sodium tomato and vegetable juice. Low-sodium or reduced-sodium tomato sauce and tomato paste. Low-sodium or reduced-sodium canned vegetables. Fruits All fresh, dried, or frozen fruit. Canned fruit in natural juice (without  added sugar). Meat and other protein foods Skinless chicken or turkey. Ground chicken or turkey. Pork with fat trimmed off. Fish and seafood. Egg whites. Dried beans, peas, or lentils. Unsalted nuts, nut butters, and seeds. Unsalted canned beans. Lean cuts of beef with fat trimmed off. Low-sodium, lean deli meat. Dairy Low-fat (1%) or fat-free (skim) milk. Fat-free, low-fat, or reduced-fat cheeses. Nonfat, low-sodium ricotta or cottage cheese. Low-fat or nonfat yogurt. Low-fat, low-sodium cheese. Fats and oils Soft margarine without trans fats. Vegetable oil. Low-fat, reduced-fat, or light mayonnaise and salad dressings (reduced-sodium). Canola, safflower, olive, soybean, and sunflower oils. Avocado. Seasoning and other foods Herbs. Spices. Seasoning mixes without salt. Unsalted popcorn and pretzels. Fat-free sweets. What foods are not recommended? The items listed may not be a complete list. Talk with your dietitian about what dietary choices are best for you. Grains Baked goods made with fat, such as croissants, muffins, or some breads. Dry pasta or rice meal packs. Vegetables Creamed or fried vegetables. Vegetables in a cheese sauce. Regular canned vegetables (not low-sodium or reduced-sodium). Regular canned tomato sauce and paste (not low-sodium or reduced-sodium). Regular tomato and vegetable juice (not low-sodium or reduced-sodium). Pickles. Olives. Fruits Canned fruit in a light or heavy syrup. Fried fruit. Fruit in cream or butter sauce. Meat and other protein foods Fatty cuts of meat. Ribs. Fried meat. Bacon. Sausage. Bologna and other processed lunch meats. Salami. Fatback. Hotdogs. Bratwurst. Salted nuts and seeds. Canned beans with added salt. Canned or smoked fish. Whole eggs or egg yolks. Chicken or turkey with skin. Dairy Whole or 2% milk, cream, and half-and-half. Whole or full-fat cream cheese. Whole-fat or sweetened yogurt. Full-fat cheese. Nondairy creamers. Whipped toppings.  Processed cheese and cheese spreads. Fats and oils Butter. Stick margarine. Lard. Shortening. Ghee. Bacon fat. Tropical oils, such as coconut, palm kernel, or palm oil. Seasoning and other foods Salted popcorn and pretzels. Onion salt, garlic salt, seasoned salt, table salt, and sea salt. Worcestershire sauce. Tartar sauce. Barbecue sauce. Teriyaki sauce. Soy sauce, including reduced-sodium. Steak sauce. Canned and packaged gravies. Fish sauce. Oyster sauce. Cocktail sauce. Horseradish that you find on the shelf. Ketchup. Mustard. Meat flavorings and tenderizers. Bouillon cubes. Hot sauce and Tabasco sauce. Premade or packaged marinades. Premade or packaged taco seasonings. Relishes. Regular salad dressings. Where to find more information:  National Heart, Lung, and Blood Institute: www.nhlbi.nih.gov  American Heart Association: www.heart.org Summary  The DASH eating plan is a healthy eating plan that has been shown to reduce high blood pressure (hypertension). It may also reduce your risk for type 2 diabetes, heart disease, and stroke.  With the DASH eating plan, you should limit salt (sodium) intake to 2,300 mg a day. If you have hypertension, you may need to reduce your sodium intake to 1,500 mg a day.  When on the DASH eating plan, aim to eat more fresh fruits and vegetables, whole grains, lean proteins, low-fat dairy, and heart-healthy fats.  Work with your health care provider or diet and nutrition specialist (dietitian) to adjust your eating plan to your   individual calorie needs. This information is not intended to replace advice given to you by your health care provider. Make sure you discuss any questions you have with your health care provider. Document Revised: 03/23/2017 Document Reviewed: 04/03/2016 Elsevier Patient Education  2020 Elsevier Inc.  

## 2020-04-19 NOTE — Assessment & Plan Note (Signed)
Chronic, ongoing.  Continue current medication regimen and adjust as needed.  Lipid panel next visit.    

## 2020-04-19 NOTE — Assessment & Plan Note (Signed)
Recommended eating smaller high protein, low fat meals more frequently and exercising 30 mins a day 5 times a week with a goal of 10-15lb weight loss in the next 3 months. Patient voiced their understanding and motivation to adhere to these recommendations.  

## 2020-04-19 NOTE — Assessment & Plan Note (Signed)
Is working on cutting back.  I have recommended complete cessation of tobacco use. I have discussed various options available for assistance with tobacco cessation including over the counter methods (Nicotine gum, patch and lozenges). We also discussed prescription options (Chantix, Nicotine Inhaler / Nasal Spray). The patient is not interested in pursuing any prescription tobacco cessation options at this time.

## 2020-04-19 NOTE — Assessment & Plan Note (Signed)
Chronic, ongoing with improvement with change in medications recent visit.  Continue Losartan-HCTZ, refills as needed, and adjust as needed. Refuses labs today, plan to recheck next visit.  Recommend he monitor BP at least a few mornings a week at home and document.  DASH diet at home.  Return in 6 months for physical.

## 2020-05-11 ENCOUNTER — Encounter: Payer: Self-pay | Admitting: Nurse Practitioner

## 2020-05-12 NOTE — Telephone Encounter (Signed)
Please route to appropriate provider.  Thank you!

## 2020-05-13 ENCOUNTER — Other Ambulatory Visit: Payer: Self-pay | Admitting: Nurse Practitioner

## 2020-05-13 MED ORDER — HYDROCHLOROTHIAZIDE 12.5 MG PO TABS: 12.5000 mg | ORAL_TABLET | Freq: Every day | ORAL | 3 refills | Status: DC

## 2020-05-13 MED ORDER — LOSARTAN POTASSIUM 100 MG PO TABS: 100.0000 mg | ORAL_TABLET | Freq: Every day | ORAL | 3 refills | Status: DC

## 2020-06-14 ENCOUNTER — Other Ambulatory Visit: Payer: Self-pay | Admitting: Family Medicine

## 2020-08-10 ENCOUNTER — Other Ambulatory Visit: Payer: Self-pay

## 2020-08-10 ENCOUNTER — Encounter: Payer: Self-pay | Admitting: Nurse Practitioner

## 2020-08-10 ENCOUNTER — Ambulatory Visit (INDEPENDENT_AMBULATORY_CARE_PROVIDER_SITE_OTHER): Payer: BC Managed Care – PPO | Admitting: Nurse Practitioner

## 2020-08-10 VITALS — BP 152/98 | HR 80 | Temp 98.5°F | Ht 69.3 in | Wt 217.0 lb

## 2020-08-10 DIAGNOSIS — E7800 Pure hypercholesterolemia, unspecified: Secondary | ICD-10-CM | POA: Diagnosis not present

## 2020-08-10 DIAGNOSIS — F17210 Nicotine dependence, cigarettes, uncomplicated: Secondary | ICD-10-CM

## 2020-08-10 DIAGNOSIS — I10 Essential (primary) hypertension: Secondary | ICD-10-CM | POA: Diagnosis not present

## 2020-08-10 DIAGNOSIS — E6609 Other obesity due to excess calories: Secondary | ICD-10-CM | POA: Diagnosis not present

## 2020-08-10 DIAGNOSIS — Z6830 Body mass index (BMI) 30.0-30.9, adult: Secondary | ICD-10-CM

## 2020-08-10 DIAGNOSIS — Z23 Encounter for immunization: Secondary | ICD-10-CM | POA: Diagnosis not present

## 2020-08-10 DIAGNOSIS — Z0000 Encounter for general adult medical examination without abnormal findings: Secondary | ICD-10-CM

## 2020-08-10 DIAGNOSIS — M545 Low back pain, unspecified: Secondary | ICD-10-CM

## 2020-08-10 DIAGNOSIS — G8929 Other chronic pain: Secondary | ICD-10-CM

## 2020-08-10 LAB — URINALYSIS, ROUTINE W REFLEX MICROSCOPIC
Bilirubin, UA: NEGATIVE
Glucose, UA: NEGATIVE
Ketones, UA: NEGATIVE
Leukocytes,UA: NEGATIVE
Nitrite, UA: NEGATIVE
Protein,UA: NEGATIVE
RBC, UA: NEGATIVE
Specific Gravity, UA: 1.025 (ref 1.005–1.030)
Urobilinogen, Ur: 0.2 mg/dL (ref 0.2–1.0)
pH, UA: 6 (ref 5.0–7.5)

## 2020-08-10 LAB — MICROALBUMIN, URINE WAIVED
Creatinine, Urine Waived: 300 mg/dL (ref 10–300)
Microalb, Ur Waived: 10 mg/L (ref 0–19)
Microalb/Creat Ratio: <30 mg/g (ref <30)

## 2020-08-10 MED ORDER — HYDROCHLOROTHIAZIDE 25 MG PO TABS: 25.0000 mg | ORAL_TABLET | Freq: Every day | ORAL | 1 refills | Status: DC

## 2020-08-10 NOTE — Assessment & Plan Note (Signed)
Chronic, ongoing. BP today 152/98. When he checks it at home, it is normally 150s/80s. Will increase HCTZ to 25mg  daily. New prescription sent to pharmacy. Continue DASH diet and exercise. Check CMP, CBC, and urine microalbumin. Will follow-up in 3 months.

## 2020-08-10 NOTE — Assessment & Plan Note (Signed)
BMI 31.77. Discussed diet and exercise. Goal is to lose 1-2 pounds per week.

## 2020-08-10 NOTE — Progress Notes (Signed)
BP (!) 152/98   Pulse 80   Temp 98.5 F (36.9 C) (Oral)   Ht 5' 9.3" (1.76 m)   Wt 217 lb (98.4 kg)   SpO2 97%   BMI 31.77 kg/m    Subjective:    Patient ID: Jeffrey Hunter, male    DOB: 05-Mar-1961, [age] y.o.   MRN: [id number]   HPI: Jeffrey Hunter is a 60 y.o. male presenting on 08/10/2020 for comprehensive medical examination. Current medical complaints include:none   HYPERTENSION / HYPERLIPIDEMIA  Satisfied with current treatment? yes Duration of hypertension: chronic BP monitoring frequency: a few times a week BP range: 150s/80s BP medication side effects: no Past BP meds: HCTZ and losartan (cozaar) Duration of hyperlipidemia: chronic Cholesterol medication side effects: no Cholesterol supplements: none Past cholesterol medications: atorvastain (lipitor) Medication compliance: excellent compliance Aspirin: yes Recent stressors: no Recurrent headaches: no Visual changes: no Palpitations: no Dyspnea: no Chest pain: no Lower extremity edema: no Dizzy/lightheaded: no   Interim Problems from his last visit: no  Depression Screen done today and results listed below:  [facility]10[facility]2020 06/19/2019  Decreased Interest 0 0  Down, Depressed, Hopeless 0 0  PHQ - 2 Score 0 0  Altered sleeping - 0  Tired, decreased energy - 1  Change in appetite - 0  Feeling bad or failure about yourself  - 0  Trouble concentrating - 0  Moving slowly or fidgety/restless - 0  Suicidal thoughts - 0  PHQ-9 Score - 1    The patient does not have a history of falls. I did not complete a risk assessment for falls. A plan of care for falls was not documented.   Past Medical History:  Past Medical History:  Diagnosis Date  . Chronic lower back pain   . Hyperlipidemia   . Hypertension     Surgical History:  History reviewed. No pertinent surgical history.  Medications:  Current Outpatient Medications on File Prior to Visit  Medication Sig  . ASPIRIN 81 PO  Take by mouth as needed.  Marland Kitchen. atorvastatin (LIPITOR) 10 MG tablet Take 1 tablet (10 mg total) by mouth daily.  . cetirizine (ZYRTEC) 10 MG tablet Take 1 tablet (10 mg total) by mouth daily.  . cyclobenzaprine (FLEXERIL) 10 MG tablet Take 1 tablet (10 mg total) by mouth 3 (three) times daily as needed for muscle spasms. DO NOT DRIVE WHILE TAKING THIS MEDICINE  . losartan (COZAAR) 100 MG tablet Take 1 tablet (100 mg total) by mouth daily.  . meloxicam (MOBIC) 15 MG tablet TAKE 1 TABLET BY MOUTH EVERY DAY  . fluticasone (FLONASE) 50 MCG/ACT nasal spray Place 2 sprays into both nostrils daily. (Patient not taking: Reported on 08/10/2020)   No current facility-administered medications on file prior to visit.    Allergies:  No Known Allergies  Social History:  Social History   Socioeconomic History  . Marital status: Married    Spouse name: Not on file  . Number of children: Not on file  . Years of education: Not on file  . Highest education level: Not on file  Occupational History  . Not on file  Tobacco Use  . Smoking status: Current Every Day Smoker    Packs/day: 0.50    Years: 36.00    Pack years: 18.00    Types: Cigarettes  . Smokeless tobacco: Never Used  Vaping Use  . Vaping Use: Never used  Substance and Sexual Activity  . Alcohol use: Yes  Comment: ocassionally  . Drug use: Never  . Sexual activity: Yes  Other Topics Concern  . Not on file  Social History Narrative  . Not on file   Social Determinants of Health   Financial Resource Strain: Not on file  Food Insecurity: Not on file  Transportation Needs: Not on file  Physical Activity: Not on file  Stress: Not on file  Social Connections: Not on file  Intimate Partner Violence: Not on file   Social History   Tobacco Use  Smoking Status Current Every Day Smoker  . Packs/day: 0.50  . Years: 36.00  . Pack years: 18.00  . Types: Cigarettes  Smokeless Tobacco Never Used   Social History   Substance and  Sexual Activity  Alcohol Use Yes   Comment: ocassionally    Family History:  Family History  Problem Relation Age of Onset  . Diabetes Sister   . Heart attack Paternal Grandfather     Past medical history, surgical history, medications, allergies, family history and social history reviewed with patient today and changes made to appropriate areas of the chart.   Review of Systems  Constitutional: Positive for malaise/fatigue. Negative for weight loss.  HENT: Negative.   Eyes: Negative.   Respiratory: Negative.   Cardiovascular: Negative.   Gastrointestinal: Negative.   Genitourinary: Negative.   Musculoskeletal: Positive for joint pain (chronic).  Skin: Negative.   Neurological: Negative.   Endo/Heme/Allergies: Positive for environmental allergies.  Psychiatric/Behavioral: Negative.    All other ROS negative except what is listed above and in the HPI.      Objective:    BP (!) 152/98   Pulse 80   Temp 98.5 F (36.9 C) (Oral)   Ht 5' 9.3" (1.76 m)   Wt 217 lb (98.4 kg)   SpO2 97%   BMI 31.77 kg/m   Wt Readings from Last 3 Encounters:  08/10/20 217 lb (98.4 kg)  04/19/20 202 lb 6 oz (91.8 kg)  03/12/20 202 lb 12.8 oz (92 kg)    Physical Exam Vitals and nursing note reviewed.  Constitutional:      Appearance: Normal appearance. He is obese.  HENT:     Head: Normocephalic and atraumatic.     Right Ear: Tympanic membrane and ear canal normal.     Left Ear: Ear canal and external ear normal. There is impacted cerumen.     Ears:     Comments: Right ear canal has dry, flaky skin    Nose: Nose normal.     Mouth/Throat:     Mouth: Mucous membranes are moist.     Pharynx: Oropharynx is clear.  Eyes:     Conjunctiva/sclera: Conjunctivae normal.  Cardiovascular:     Rate and Rhythm: Normal rate and regular rhythm.     Pulses: Normal pulses.     Heart sounds: Normal heart sounds.  Pulmonary:     Effort: Pulmonary effort is normal.     Breath sounds: Normal breath  sounds.  Abdominal:     General: Bowel sounds are normal.     Palpations: Abdomen is soft.     Tenderness: There is no abdominal tenderness.  Musculoskeletal:        General: Normal range of motion.     Cervical back: Normal range of motion and neck supple.  Lymphadenopathy:     Cervical: No cervical adenopathy.  Skin:    General: Skin is warm and dry.  Neurological:     General: No focal deficit present.  Mental Status: He is alert and oriented to person, place, and time.     Cranial Nerves: No cranial nerve deficit.     Gait: Gait normal.     Deep Tendon Reflexes: Reflexes normal.  Psychiatric:        Mood and Affect: Mood normal.        Behavior: Behavior normal.        Thought Content: Thought content normal.        Judgment: Judgment normal.     Results for orders placed or performed in visit on 08/10/20  Cologuard  Result Value Ref Range   Cologuard Negative Negative      Assessment & Plan:   Problem List Items Addressed This Visit      Cardiovascular and Mediastinum   Essential hypertension - Primary    Chronic, ongoing. BP today 152/98. When he checks it at home, it is normally 150s/80s. Will increase HCTZ to 25mg  daily. New prescription sent to pharmacy. Continue DASH diet and exercise. Check CMP, CBC, and urine microalbumin. Will follow-up in 3 months.       Relevant Medications   hydrochlorothiazide (HYDRODIURIL) 25 MG tablet   Other Relevant Orders   CBC with Differential/Platelet   Comprehensive metabolic panel   Microalbumin, Urine Waived     Other   Hyperlipidemia    Chronic, stable. Continue current regimen. Will check lipid panel today.       Relevant Medications   hydrochlorothiazide (HYDRODIURIL) 25 MG tablet   Nicotine dependence, cigarettes, uncomplicated    Has decreased to 1/2ppd smoking. Discussed complete smoking cessation. Not currently interested at this time. Currently at 18 pack year history.      Relevant Orders    Pneumococcal polysaccharide vaccine 23-valent greater than or equal to 2yo subcutaneous/IM (Completed)   Urinalysis, Routine w reflex microscopic   Chronic low back pain    Chronic, stable. Controlled with mobic. Continue current regimen. Call with any concerns.       Obesity    BMI 31.77. Discussed diet and exercise. Goal is to lose 1-2 pounds per week.        Other Visit Diagnoses    Routine general medical examination at a health care facility       Relevant Orders   TSH   PSA   Lipid panel   CBC with Differential/Platelet   Comprehensive metabolic panel   Urinalysis, Routine w reflex microscopic       Discussed aspirin prophylaxis for myocardial infarction prevention and decision was made to continue ASA  LABORATORY TESTING:  Health maintenance labs ordered today as discussed above.   The natural history of prostate cancer and ongoing controversy regarding screening and potential treatment outcomes of prostate cancer has been discussed with the patient. The meaning of a false positive PSA and a false negative PSA has been discussed. He indicates understanding of the limitations of this screening test and wishes to proceed with screening PSA testing.   IMMUNIZATIONS:   - Tdap: Tetanus vaccination status reviewed: last tetanus booster within 10 years. - Influenza: Postponed to flu season - Pneumovax: Administered today - Prevnar: Not applicable - HPV: Not applicable  SCREENING: - Colonoscopy: Up to date  Discussed with patient purpose of the colonoscopy is to detect colon cancer at curable precancerous or early stages   - AAA Screening: Not applicable  -Hearing Test: Not applicable  -Spirometry: Not applicable   PATIENT COUNSELING:    Sexuality: Discussed sexually transmitted diseases, partner selection, use  of condoms, avoidance of unintended pregnancy  and contraceptive alternatives.   Advised to avoid cigarette smoking.  I discussed with the patient that most  people either abstain from alcohol or drink within safe limits (<=14/week and <=4 drinks/occasion for males, <=7/weeks and <= 3 drinks/occasion for females) and that the risk for alcohol disorders and other health effects rises proportionally with the number of drinks per week and how often a drinker exceeds daily limits.  Discussed cessation/primary prevention of drug use and availability of treatment for abuse.   Diet: Encouraged to adjust caloric intake to maintain  or achieve ideal body weight, to reduce intake of dietary saturated fat and total fat, to limit sodium intake by avoiding high sodium foods and not adding table salt, and to maintain adequate dietary potassium and calcium preferably from fresh fruits, vegetables, and low-fat dairy products.    stressed the importance of regular exercise  Injury prevention: Discussed safety belts, safety helmets, smoke detector, smoking near bedding or upholstery.   Dental health: Discussed importance of regular tooth brushing, flossing, and dental visits.   Follow up plan: NEXT PREVENTATIVE PHYSICAL DUE IN 1 YEAR. Return in about 3 months (around 11/09/2020) for htn.

## 2020-08-10 NOTE — Assessment & Plan Note (Signed)
Chronic, stable. Controlled with mobic. Continue current regimen. Call with any concerns.

## 2020-08-10 NOTE — Assessment & Plan Note (Signed)
Chronic, stable. Continue current regimen. Will check lipid panel today.

## 2020-08-10 NOTE — Patient Instructions (Addendum)
It was great to see you!  You can use debrox ear drops, 5 in each ear once a day to help with the ear wax. You can buy it with a bulb to rinse out your ear after with warm water, or rinse ears out in the shower. We are going to increase your hydrochlorothiazide (HCTZ) to 25mg  daily. You can take 2 pills of your current bottle until you run out and then take 1 pill from any new bottles. Keep checking your blood pressure at home at least 3 times a week. Our goal is for your blood pressure to be less than 140/90.   Let's follow-up in 3 months, sooner if you have concerns.  Take care,  , NP    Health Maintenance, Male Adopting a healthy lifestyle and getting preventive care are important in promoting health and wellness. Ask your health care provider about:  The right schedule for you to have regular tests and exams.  Things you can do on your own to prevent diseases and keep yourself healthy. What should I know about diet, weight, and exercise? Eat a healthy diet  Eat a diet that includes plenty of vegetables, fruits, low-fat dairy products, and lean protein.  Do not eat a lot of foods that are high in solid fats, added sugars, or sodium.   Maintain a healthy weight Body mass index (BMI) is a measurement that can be used to identify possible weight problems. It estimates body fat based on height and weight. Your health care provider can help determine your BMI and help you achieve or maintain a healthy weight. Get regular exercise Get regular exercise. This is one of the most important things you can do for your health. Most adults should:  Exercise for at least 150 minutes each week. The exercise should increase your heart rate and make you sweat (moderate-intensity exercise).  Do strengthening exercises at least twice a week. This is in addition to the moderate-intensity exercise.  Spend less time sitting. Even light physical activity can be beneficial. Watch  cholesterol and blood lipids Have your blood tested for lipids and cholesterol at 60 years of age, then have this test every 5 years. You may need to have your cholesterol levels checked more often if:  Your lipid or cholesterol levels are high.  You are older than 60 years of age.  You are at high risk for heart disease. What should I know about cancer screening? Many types of cancers can be detected early and may often be prevented. Depending on your health history and family history, you may need to have cancer screening at various ages. This may include screening for:  Colorectal cancer.  Prostate cancer.  Skin cancer.  Lung cancer. What should I know about heart disease, diabetes, and high blood pressure? Blood pressure and heart disease  High blood pressure causes heart disease and increases the risk of stroke. This is more likely to develop in people who have high blood pressure readings, are of African descent, or are overweight.  Talk with your health care provider about your target blood pressure readings.  Have your blood pressure checked: ? Every 3-5 years if you are 39-15 years of age. ? Every year if you are 51 years old or older.  If you are between the ages of 74 and 92 and are a current or former smoker, ask your health care provider if you should have a one-time screening for abdominal aortic aneurysm (AAA). Diabetes Have regular diabetes  screenings. This checks your fasting blood sugar level. Have the screening done:  Once every three years after age 91 if you are at a normal weight and have a low risk for diabetes.  More often and at a younger age if you are overweight or have a high risk for diabetes. What should I know about preventing infection? Hepatitis B If you have a higher risk for hepatitis B, you should be screened for this virus. Talk with your health care provider to find out if you are at risk for hepatitis B infection. Hepatitis C Blood  testing is recommended for:  Everyone born from [birth year] through 1965.  Anyone with known risk factors for hepatitis C. Sexually transmitted infections (STIs)  You should be screened each year for STIs, including gonorrhea and chlamydia, if: ? You are sexually active and are younger than 60 years of age. ? You are older than 60 years of age and your health care provider tells you that you are at risk for this type of infection. ? Your sexual activity has changed since you were last screened, and you are at increased risk for chlamydia or gonorrhea. Ask your health care provider if you are at risk.  Ask your health care provider about whether you are at high risk for HIV. Your health care provider may recommend a prescription medicine to help prevent HIV infection. If you choose to take medicine to prevent HIV, you should first get tested for HIV. You should then be tested every 3 months for as long as you are taking the medicine. Follow these instructions at home: Lifestyle  Do not use any products that contain nicotine or tobacco, such as cigarettes, e-cigarettes, and chewing tobacco. If you need help quitting, ask your health care provider.  Do not use street drugs.  Do not share needles.  Ask your health care provider for help if you need support or information about quitting drugs. Alcohol use  Do not drink alcohol if your health care provider tells you not to drink.  If you drink alcohol: ? Limit how much you have to 0-2 drinks a day. ? Be aware of how much alcohol is in your drink. In the [place] one drink equals one 12 oz bottle of beer (355 mL), one 5 oz glass of wine (148 mL), or one 1 oz glass of hard liquor (44 mL). General instructions  Schedule regular health, dental, and eye exams.  Stay current with your vaccines.  Tell your health care provider if: ? You often feel depressed. ? You have ever been abused or do not feel safe at home. Summary  Adopting a healthy  lifestyle and getting preventive care are important in promoting health and wellness.  Follow your health care provider's instructions about healthy diet, exercising, and getting tested or screened for diseases.  Follow your health care provider's instructions on monitoring your cholesterol and blood pressure. This information is not intended to replace advice given to you by your health care provider. Make sure you discuss any questions you have with your health care provider. Document Revised: 04/03/2018 Document Reviewed: 04/03/2018 Elsevier Patient Education  2021 [employer]   Pneumococcal Polysaccharide Vaccine (PPSV23): What You Need to Know 1. Why get vaccinated? Pneumococcal polysaccharide vaccine (PPSV23) can prevent pneumococcal disease. Pneumococcal disease refers to any illness caused by pneumococcal bacteria. These bacteria can cause many types of illnesses, including pneumonia, which is an infection of the lungs. Pneumococcal bacteria are one of the most common causes  of pneumonia. Besides pneumonia, pneumococcal bacteria can also cause:  Ear infections  Sinus infections  Meningitis (infection of the tissue covering the brain and spinal cord)  Bacteremia (bloodstream infection) Anyone can get pneumococcal disease, but children under 85 years of age, people with certain medical conditions, adults 65 years or older, and cigarette smokers are at the highest risk. Most pneumococcal infections are mild. However, some can result in long-term problems, such as brain damage or hearing loss. Meningitis, bacteremia, and pneumonia caused by pneumococcal disease can be fatal. 2. PPSV23 PPSV23 protects against 23 types of bacteria that cause pneumococcal disease. PPSV23 is recommended for:  All adults 65 years or older,  Anyone 2 years or older with certain medical conditions that can lead to an increased risk for pneumococcal disease. Most people need only one dose of PPSV23. A  second dose of PPSV23, and another type of pneumococcal vaccine called PCV13, are recommended for certain high-risk groups. Your health care provider can give you more information. People 65 years or older should get a dose of PPSV23 even if they have already gotten one or more doses of the vaccine before they turned 72. 3. Talk with your health care provider Tell your vaccine provider if the person getting the vaccine:  Has had an allergic reaction after a previous dose of PPSV23, or has any severe, life-threatening allergies. In some cases, your health care provider may decide to postpone PPSV23 vaccination to a future visit. People with minor illnesses, such as a cold, may be vaccinated. People who are moderately or severely ill should usually wait until they recover before getting PPSV23. Your health care provider can give you more information. 4. Risks of a vaccine reaction  Redness or pain where the shot is given, feeling tired, fever, or muscle aches can happen after PPSV23. People sometimes faint after medical procedures, including vaccination. Tell your provider if you feel dizzy or have vision changes or ringing in the ears. As with any medicine, there is a very remote chance of a vaccine causing a severe allergic reaction, other serious injury, or death. 5. What if there is a serious problem? An allergic reaction could occur after the vaccinated person leaves the clinic. If you see signs of a severe allergic reaction (hives, swelling of the face and throat, difficulty breathing, a fast heartbeat, dizziness, or weakness), call 9-1-1 and get the person to the nearest hospital. For other signs that concern you, call your health care provider. Adverse reactions should be reported to the Vaccine Adverse Event Reporting System (VAERS). Your health care provider will usually file this report, or you can do it yourself. Visit the VAERS website at www.vaers.LAgents.no or call (847) 346-1500. VAERS is  only for reporting reactions, and VAERS staff do not give medical advice. 6. How can I learn more?  Ask your health care provider.  Call your local or state health department.  Contact the Centers for Disease Control and Prevention (CDC): ? Call 984-607-1517 (1-800-CDC-INFO) or ? Visit CDC's website at PicCapture.uy Vaccine Information Statement PPSV23 Vaccine (02/20/2018) This information is not intended to replace advice given to you by your health care provider. Make sure you discuss any questions you have with your health care provider. Document Revised: 12/12/2019 Document Reviewed: 12/12/2019 Elsevier Patient Education  2021 [employer].

## 2020-08-10 NOTE — Assessment & Plan Note (Signed)
Has decreased to 1/2ppd smoking. Discussed complete smoking cessation. Not currently interested at this time. Currently at 18 pack year history.

## 2020-08-11 LAB — CBC WITH DIFFERENTIAL/PLATELET
Basophils Absolute: 0.1 10*3/uL (ref 0.0–0.2)
Basos: 1 %
EOS (ABSOLUTE): 0.3 10*3/uL (ref 0.0–0.4)
Eos: 4 %
Hematocrit: 47.7 % (ref 37.5–51.0)
Hemoglobin: 15.6 g/dL (ref 13.0–17.7)
Immature Grans (Abs): 0 10*3/uL (ref 0.0–0.1)
Immature Granulocytes: 0 %
Lymphocytes Absolute: 1.3 10*3/uL (ref 0.7–3.1)
Lymphs: 17 %
MCH: 28.9 pg (ref 26.6–33.0)
MCHC: 32.7 g/dL (ref 31.5–35.7)
MCV: 88 fL (ref 79–97)
Monocytes Absolute: 0.8 10*3/uL (ref 0.1–0.9)
Monocytes: 9 %
Neutrophils Absolute: 5.5 10*3/uL (ref 1.4–7.0)
Neutrophils: 69 %
Platelets: 183 10*3/uL (ref 150–450)
RBC: 5.4 x10E6/uL (ref 4.14–5.80)
RDW: 14.6 % (ref 11.6–15.4)
WBC: 8 10*3/uL (ref 3.4–10.8)

## 2020-08-11 LAB — LIPID PANEL
Chol/HDL Ratio: 5 ratio (ref 0.0–5.0)
Cholesterol, Total: 156 mg/dL (ref 100–199)
HDL: 31 mg/dL — ABNORMAL LOW (ref >39)
LDL Chol Calc (NIH): 83 mg/dL (ref 0–99)
Triglycerides: 250 mg/dL — ABNORMAL HIGH (ref 0–149)
VLDL Cholesterol Cal: 42 mg/dL — ABNORMAL HIGH (ref 5–40)

## 2020-08-11 LAB — COMPREHENSIVE METABOLIC PANEL
ALT: 20 IU/L (ref 0–44)
AST: 15 IU/L (ref 0–40)
Albumin/Globulin Ratio: 2 (ref 1.2–2.2)
Albumin: 4.5 g/dL (ref 3.8–4.9)
Alkaline Phosphatase: 85 IU/L (ref 44–121)
BUN/Creatinine Ratio: 18 (ref 10–24)
BUN: 24 mg/dL (ref 8–27)
Bilirubin Total: 0.2 mg/dL (ref 0.0–1.2)
CO2: 23 mmol/L (ref 20–29)
Calcium: 9.4 mg/dL (ref 8.6–10.2)
Chloride: 100 mmol/L (ref 96–106)
Creatinine, Ser: 1.32 mg/dL — ABNORMAL HIGH (ref 0.76–1.27)
Globulin, Total: 2.3 g/dL (ref 1.5–4.5)
Glucose: 85 mg/dL (ref 65–99)
Potassium: 4.4 mmol/L (ref 3.5–5.2)
Sodium: 139 mmol/L (ref 134–144)
Total Protein: 6.8 g/dL (ref 6.0–8.5)
eGFR: 62 mL/min/{1.73_m2} (ref >59)

## 2020-08-11 LAB — PSA: Prostate Specific Ag, Serum: 0.3 ng/mL (ref 0.0–4.0)

## 2020-08-11 LAB — TSH: TSH: 1.59 u[IU]/mL (ref 0.450–4.500)

## 2020-08-11 MED ORDER — AMLODIPINE BESYLATE 5 MG PO TABS: 5.0000 mg | ORAL_TABLET | Freq: Every day | ORAL | 2 refills | Status: DC

## 2020-08-11 NOTE — Addendum Note (Signed)
Addended by: Rodman Pickle A on: 08/11/2020 11:10 AM   Modules accepted: Orders

## 2020-08-20 ENCOUNTER — Telehealth: Payer: Self-pay | Admitting: Nurse Practitioner

## 2020-08-20 NOTE — Telephone Encounter (Signed)
noted 

## 2020-08-20 NOTE — Telephone Encounter (Signed)
Scheduled 5/6

## 2020-08-27 ENCOUNTER — Encounter: Payer: Self-pay | Admitting: Nurse Practitioner

## 2020-08-27 ENCOUNTER — Ambulatory Visit: Payer: BC Managed Care – PPO | Admitting: Nurse Practitioner

## 2020-08-27 ENCOUNTER — Other Ambulatory Visit: Payer: Self-pay

## 2020-08-27 DIAGNOSIS — I10 Essential (primary) hypertension: Secondary | ICD-10-CM | POA: Diagnosis not present

## 2020-08-27 NOTE — Assessment & Plan Note (Signed)
Blood pressure well controlled today with changing from HCTZ to amlodipine. Will recheck BMP. Follow-up at scheduled appointment in 2 months.

## 2020-08-27 NOTE — Progress Notes (Signed)
Established Patient Office Visit  Subjective:  Patient ID: Jeffrey Hunter, male    DOB: 1960/06/26  Age: 60 y.o. MRN: [id number]  CC:  Chief Complaint  Patient presents with  . Hypertension    HPI [place] presents for follow-up on hypertension. He stopped his hctz and started amlodipine about 2 weeks ago.  HYPERTENSION  Hypertension status: stable  Satisfied with current treatment? yes Duration of hypertension: chronic BP monitoring frequency:  not checking BP range: n/a BP medication side effects:  no Medication compliance: excellent compliance Previous BP meds:amlodipine and losartan (cozaar) Aspirin: yes Recurrent headaches: no Visual changes: no Palpitations: no Dyspnea: no Chest pain: no Lower extremity edema: no Dizzy/lightheaded: no   Past Medical History:  Diagnosis Date  . Chronic lower back pain   . Hyperlipidemia   . Hypertension     History reviewed. No pertinent surgical history.  Family History  Problem Relation Age of Onset  . Diabetes Sister   . Heart attack Paternal Grandfather     Social History   Socioeconomic History  . Marital status: Married    Spouse name: Not on file  . Number of children: Not on file  . Years of education: Not on file  . Highest education level: Not on file  Occupational History  . Not on file  Tobacco Use  . Smoking status: Current Every Day Smoker    Packs/day: 0.50    Years: 36.00    Pack years: 18.00    Types: Cigarettes  . Smokeless tobacco: Never Used  Vaping Use  . Vaping Use: Never used  Substance and Sexual Activity  . Alcohol use: Yes    Comment: ocassionally  . Drug use: Never  . Sexual activity: Yes  Other Topics Concern  . Not on file  Social History Narrative  . Not on file   Social Determinants of Health   Financial Resource Strain: Not on file  Food Insecurity: Not on file  Transportation Needs: Not on file  Physical Activity: Not on file  Stress: Not on file   Social Connections: Not on file  Intimate Partner Violence: Not on file    Outpatient Medications Prior to Visit  Medication Sig Dispense Refill  . amLODipine (NORVASC) 5 MG tablet Take 1 tablet (5 mg total) by mouth daily. 30 tablet 2  . ASPIRIN 81 PO Take by mouth as needed.    Marland Kitchen atorvastatin (LIPITOR) 10 MG tablet Take 1 tablet (10 mg total) by mouth daily. 90 tablet 4  . cetirizine (ZYRTEC) 10 MG tablet Take 1 tablet (10 mg total) by mouth daily. 30 tablet 11  . cyclobenzaprine (FLEXERIL) 10 MG tablet Take 1 tablet (10 mg total) by mouth 3 (three) times daily as needed for muscle spasms. DO NOT DRIVE WHILE TAKING THIS MEDICINE 90 tablet 4  . fluticasone (FLONASE) 50 MCG/ACT nasal spray Place 2 sprays into both nostrils daily. 16 g 6  . losartan (COZAAR) 100 MG tablet Take 1 tablet (100 mg total) by mouth daily. 90 tablet 3  . meloxicam (MOBIC) 15 MG tablet TAKE 1 TABLET BY MOUTH EVERY DAY 90 tablet 1   No facility-administered medications prior to visit.    No Known Allergies  ROS Review of Systems  HENT: Negative.   Eyes: Negative.   Respiratory: Negative.   Cardiovascular: Negative.   Gastrointestinal: Negative.   Genitourinary: Negative.   Musculoskeletal: Negative.   Neurological: Negative.       Objective:  Physical Exam Vitals and nursing note reviewed.  Constitutional:      Appearance: Normal appearance.  HENT:     Head: Normocephalic.  Eyes:     Conjunctiva/sclera: Conjunctivae normal.  Cardiovascular:     Rate and Rhythm: Normal rate and regular rhythm.     Pulses: Normal pulses.     Heart sounds: Normal heart sounds.  Pulmonary:     Effort: Pulmonary effort is normal.     Breath sounds: Normal breath sounds.  Musculoskeletal:     Cervical back: Normal range of motion.  Skin:    General: Skin is warm and dry.  Neurological:     General: No focal deficit present.     Mental Status: He is alert and oriented to person, place, and time.   Psychiatric:        Mood and Affect: Mood normal.        Behavior: Behavior normal.        Thought Content: Thought content normal.        Judgment: Judgment normal.     BP 132/78 (BP Location: Left Arm, Patient Position: Sitting)   Pulse 77   Temp 98.8 F (37.1 C) (Oral)   Wt 216 lb 6.4 oz (98.2 kg)   SpO2 95%   BMI 31.68 kg/m  Wt Readings from Last 3 Encounters:  08/27/20 216 lb 6.4 oz (98.2 kg)  08/10/20 217 lb (98.4 kg)  04/19/20 202 lb 6 oz (91.8 kg)     Health Maintenance Due  Topic Date Due  . COVID-19 Vaccine (3 - Booster for Pfizer series) 08/13/2020    There are no preventive care reminders to display for this patient.  Lab Results  Component Value Date   TSH 1.590 08/10/2020   Lab Results  Component Value Date   WBC 8.0 08/10/2020   HGB 15.6 08/10/2020   HCT 47.7 08/10/2020   MCV 88 08/10/2020   PLT 183 08/10/2020   Lab Results  Component Value Date   NA 139 08/10/2020   K 4.4 08/10/2020   CO2 23 08/10/2020   GLUCOSE 85 08/10/2020   BUN 24 08/10/2020   CREATININE 1.32 (H) 08/10/2020   BILITOT 0.2 08/10/2020   ALKPHOS 85 08/10/2020   AST 15 08/10/2020   ALT 20 08/10/2020   PROT 6.8 08/10/2020   ALBUMIN 4.5 08/10/2020   CALCIUM 9.4 08/10/2020   EGFR 62 08/10/2020   Lab Results  Component Value Date   CHOL 156 08/10/2020   Lab Results  Component Value Date   HDL 31 (L) 08/10/2020   Lab Results  Component Value Date   LDLCALC 83 08/10/2020   Lab Results  Component Value Date   TRIG 250 (H) 08/10/2020   Lab Results  Component Value Date   CHOLHDL 5.0 08/10/2020   No results found for: HGBA1C    Assessment & Plan:   Problem List Items Addressed This Visit      Cardiovascular and Mediastinum   Essential hypertension    Blood pressure well controlled today with changing from HCTZ to amlodipine. Will recheck BMP. Follow-up at scheduled appointment in 2 months.          No orders of the defined types were placed in this  encounter.    Follow-up: No follow-ups on file.    Charyl Dancer, NP

## 2020-08-28 LAB — BASIC METABOLIC PANEL
BUN/Creatinine Ratio: 19 (ref 10–24)
BUN: 23 mg/dL (ref 8–27)
CO2: 19 mmol/L — ABNORMAL LOW (ref 20–29)
Calcium: 9.5 mg/dL (ref 8.6–10.2)
Chloride: 104 mmol/L (ref 96–106)
Creatinine, Ser: 1.18 mg/dL (ref 0.76–1.27)
Glucose: 90 mg/dL (ref 65–99)
Potassium: 4.1 mmol/L (ref 3.5–5.2)
Sodium: 142 mmol/L (ref 134–144)
eGFR: 71 mL/min/{1.73_m2} (ref >59)

## 2020-11-09 NOTE — Progress Notes (Signed)
BP (!) 151/97 (BP Location: Right Arm, Cuff Size: Large)   Pulse 78   Temp 98.5 F (36.9 C)   Ht 5' 8.54" (1.741 m)   Wt 211 lb 2 oz (95.8 kg)   SpO2 97%   BMI 31.59 kg/m    Subjective:    Patient ID: Jeffrey Hunter, male    DOB: 01-01-1961, 60 y.o.   MRN: [id number]  HPI: Jeffrey Hunter is a 60 y.o. male  Risk analyst Complaint  Patient presents with   Hypertension    Refill on Amlodipine   Hyperlipidemia   Back Pain    Refill on meloxicam    HYPERTENSION / HYPERLIPIDEMIA Satisfied with current treatment? no Duration of hypertension: years BP monitoring frequency: monthly BP range: 150/69 BP medication side effects: no Past BP meds: amlodipine and losartan (cozaar) Duration of hyperlipidemia: years Cholesterol medication side effects: no Cholesterol supplements: none Past cholesterol medications: atorvastain (lipitor) Medication compliance: excellent compliance Aspirin: yes Recent stressors: no Recurrent headaches: no Visual changes: no Palpitations: no Dyspnea: no Chest pain: no Lower extremity edema: no Dizzy/lightheaded: no  Relevant past medical, surgical, family and social history reviewed and updated as indicated. Interim medical history since our last visit reviewed. Allergies and medications reviewed and updated.  Review of Systems  Eyes:  Negative for visual disturbance.  Respiratory:  Negative for chest tightness and shortness of breath.   Cardiovascular:  Negative for chest pain, palpitations and leg swelling.  Neurological:  Negative for dizziness, light-headedness and headaches.   Per HPI unless specifically indicated above     Objective:    BP (!) 151/97 (BP Location: Right Arm, Cuff Size: Large)   Pulse 78   Temp 98.5 F (36.9 C)   Ht 5' 8.54" (1.741 m)   Wt 211 lb 2 oz (95.8 kg)   SpO2 97%   BMI 31.59 kg/m   Wt Readings from Last 3 Encounters:  11/10/20 211 lb 2 oz (95.8 kg)  08/27/20 216 lb 6.4 oz (98.2 kg)  08/10/20 217 lb  (98.4 kg)    Physical Exam Vitals and nursing note reviewed.  Constitutional:      General: He is not in acute distress.    Appearance: Normal appearance. He is not ill-appearing, toxic-appearing or diaphoretic.  HENT:     Head: Normocephalic.     Right Ear: External ear normal.     Left Ear: External ear normal.     Nose: Nose normal. No congestion or rhinorrhea.     Mouth/Throat:     Mouth: Mucous membranes are moist.  Eyes:     General:        Right eye: No discharge.        Left eye: No discharge.     Extraocular Movements: Extraocular movements intact.     Conjunctiva/sclera: Conjunctivae normal.     Pupils: Pupils are equal, round, and reactive to light.  Cardiovascular:     Rate and Rhythm: Normal rate and regular rhythm.     Heart sounds: No murmur heard. Pulmonary:     Effort: Pulmonary effort is normal. No respiratory distress.     Breath sounds: Normal breath sounds. No wheezing, rhonchi or rales.  Abdominal:     General: Abdomen is flat. Bowel sounds are normal.  Musculoskeletal:     Cervical back: Normal range of motion and neck supple.  Skin:    General: Skin is warm and dry.     Capillary Refill: Capillary refill takes less than  2 seconds.  Neurological:     General: No focal deficit present.     Mental Status: He is alert and oriented to person, place, and time.  Psychiatric:        Mood and Affect: Mood normal.        Behavior: Behavior normal.        Thought Content: Thought content normal.        Judgment: Judgment normal.    Results for orders placed or performed in visit on 58/34/62  Basic Metabolic Panel (BMET)  Result Value Ref Range   Glucose 90 65 - 99 mg/dL   BUN 23 8 - 27 mg/dL   Creatinine, Ser 1.18 0.76 - 1.27 mg/dL   eGFR 71 >59 mL/min/1.73   BUN/Creatinine Ratio 19 10 - 24   Sodium 142 134 - 144 mmol/L   Potassium 4.1 3.5 - 5.2 mmol/L   Chloride 104 96 - 106 mmol/L   CO2 19 (L) 20 - 29 mmol/L   Calcium 9.5 8.6 - 10.2 mg/dL       Assessment & Plan:   Problem List Items Addressed This Visit       Cardiovascular and Mediastinum   Essential hypertension - Primary    Chronic. Elevated at visit and at home. Will increase amlodipine 60m. Labs ordered today to recheck kidney function.  Will make recommendations based on lab results.  Follow up in 3 months Call sooner if concerns arise.        Relevant Medications   amLODipine (NORVASC) 10 MG tablet   Other Relevant Orders   Comp Met (CMET)   Lipid Profile     Other   Hyperlipidemia    Chronic.  Controlled.  Continue with current medication regimen.  Labs ordered today.  Return to clinic in 3 months for reevaluation.  Call sooner if concerns arise.         Relevant Medications   amLODipine (NORVASC) 10 MG tablet   Other Relevant Orders   Comp Met (CMET)   Lipid Profile   Nicotine dependence, cigarettes, uncomplicated    Has decreased to 1/2 pack or less per day. Discussed complete smoking cessation. Not currently interested at this time. Currently at 18 pack year history.  Will reassess at future visits.          Follow up plan: Return in about 3 months (around 02/10/2021) for HTN, HLD, DM2 FU.

## 2020-11-10 ENCOUNTER — Ambulatory Visit: Payer: BC Managed Care – PPO | Admitting: Nurse Practitioner

## 2020-11-10 ENCOUNTER — Encounter: Payer: Self-pay | Admitting: Nurse Practitioner

## 2020-11-10 ENCOUNTER — Other Ambulatory Visit: Payer: Self-pay

## 2020-11-10 VITALS — BP 151/97 | HR 78 | Temp 98.5°F | Ht 68.54 in | Wt 211.1 lb

## 2020-11-10 DIAGNOSIS — I10 Essential (primary) hypertension: Secondary | ICD-10-CM | POA: Diagnosis not present

## 2020-11-10 DIAGNOSIS — F17210 Nicotine dependence, cigarettes, uncomplicated: Secondary | ICD-10-CM | POA: Diagnosis not present

## 2020-11-10 DIAGNOSIS — E7800 Pure hypercholesterolemia, unspecified: Secondary | ICD-10-CM | POA: Diagnosis not present

## 2020-11-10 MED ORDER — AMLODIPINE BESYLATE 10 MG PO TABS: 10.0000 mg | ORAL_TABLET | Freq: Every day | ORAL | 1 refills | Status: DC

## 2020-11-10 NOTE — Assessment & Plan Note (Signed)
Has decreased to 1/2 pack or less per day. Discussed complete smoking cessation. Not currently interested at this time. Currently at 18 pack year history.  Will reassess at future visits.

## 2020-11-10 NOTE — Assessment & Plan Note (Signed)
Chronic. Elevated at visit and at home. Will increase amlodipine 10mg . Labs ordered today to recheck kidney function.  Will make recommendations based on lab results.  Follow up in 3 months Call sooner if concerns arise.

## 2020-11-10 NOTE — Assessment & Plan Note (Signed)
Chronic.  Controlled.  Continue with current medication regimen.  Labs ordered today.  Return to clinic in 3 months for reevaluation.  Call sooner if concerns arise.   

## 2020-11-11 LAB — LIPID PANEL
Chol/HDL Ratio: 4.8 ratio (ref 0.0–5.0)
Cholesterol, Total: 150 mg/dL (ref 100–199)
HDL: 31 mg/dL — ABNORMAL LOW (ref >39)
LDL Chol Calc (NIH): 89 mg/dL (ref 0–99)
Triglycerides: 171 mg/dL — ABNORMAL HIGH (ref 0–149)
VLDL Cholesterol Cal: 30 mg/dL (ref 5–40)

## 2020-11-11 LAB — COMPREHENSIVE METABOLIC PANEL
ALT: 19 IU/L (ref 0–44)
AST: 14 IU/L (ref 0–40)
Albumin/Globulin Ratio: 1.9 (ref 1.2–2.2)
Albumin: 4.5 g/dL (ref 3.8–4.9)
Alkaline Phosphatase: 96 IU/L (ref 44–121)
BUN/Creatinine Ratio: 20 (ref 10–24)
BUN: 22 mg/dL (ref 8–27)
Bilirubin Total: 0.3 mg/dL (ref 0.0–1.2)
CO2: 24 mmol/L (ref 20–29)
Calcium: 9.4 mg/dL (ref 8.6–10.2)
Chloride: 103 mmol/L (ref 96–106)
Creatinine, Ser: 1.11 mg/dL (ref 0.76–1.27)
Globulin, Total: 2.4 g/dL (ref 1.5–4.5)
Glucose: 78 mg/dL (ref 65–99)
Potassium: 4.6 mmol/L (ref 3.5–5.2)
Sodium: 141 mmol/L (ref 134–144)
Total Protein: 6.9 g/dL (ref 6.0–8.5)
eGFR: 76 mL/min/{1.73_m2} (ref >59)

## 2020-11-11 NOTE — Progress Notes (Signed)
Hi [place]. Your lab work looks good. Your cholesterol has improved from prior. Continue with a low fat diet and exercise.

## 2020-12-28 ENCOUNTER — Other Ambulatory Visit: Payer: Self-pay

## 2020-12-28 MED ORDER — MELOXICAM 15 MG PO TABS: 15.0000 mg | ORAL_TABLET | Freq: Every day | ORAL | 1 refills | Status: DC

## 2020-12-28 NOTE — Telephone Encounter (Signed)
Patient is requesting a refill of Meloxicam 15mg  QD  Last appointment 11/10/20  Up coming visit 1020/22

## 2020-12-28 NOTE — Telephone Encounter (Signed)
Request sent to PCP

## 2021-02-06 ENCOUNTER — Other Ambulatory Visit: Payer: Self-pay | Admitting: Nurse Practitioner

## 2021-02-06 DIAGNOSIS — J302 Other seasonal allergic rhinitis: Secondary | ICD-10-CM

## 2021-02-10 ENCOUNTER — Encounter: Payer: Self-pay | Admitting: Nurse Practitioner

## 2021-02-10 ENCOUNTER — Other Ambulatory Visit: Payer: Self-pay

## 2021-02-10 ENCOUNTER — Ambulatory Visit: Payer: BC Managed Care – PPO | Admitting: Nurse Practitioner

## 2021-02-10 VITALS — BP 138/85 | HR 83 | Temp 97.6°F | Wt 222.6 lb

## 2021-02-10 DIAGNOSIS — I10 Essential (primary) hypertension: Secondary | ICD-10-CM | POA: Diagnosis not present

## 2021-02-10 DIAGNOSIS — E7800 Pure hypercholesterolemia, unspecified: Secondary | ICD-10-CM | POA: Diagnosis not present

## 2021-02-10 MED ORDER — CYCLOBENZAPRINE HCL 10 MG PO TABS: 10.0000 mg | ORAL_TABLET | Freq: Three times a day (TID) | ORAL | 1 refills | Status: DC | PRN

## 2021-02-10 MED ORDER — ATORVASTATIN CALCIUM 10 MG PO TABS: 10.0000 mg | ORAL_TABLET | Freq: Every day | ORAL | 1 refills | Status: DC

## 2021-02-10 MED ORDER — MELOXICAM 15 MG PO TABS: 15.0000 mg | ORAL_TABLET | Freq: Every day | ORAL | 1 refills | Status: DC

## 2021-02-10 MED ORDER — LOSARTAN POTASSIUM 100 MG PO TABS: 100.0000 mg | ORAL_TABLET | Freq: Every day | ORAL | 1 refills | Status: DC

## 2021-02-10 MED ORDER — AMLODIPINE BESYLATE 10 MG PO TABS: 10.0000 mg | ORAL_TABLET | Freq: Every day | ORAL | 1 refills | Status: DC

## 2021-02-10 NOTE — Progress Notes (Signed)
BP 138/85   Pulse 83   Temp 97.6 F (36.4 C) (Oral)   Wt 222 lb 9.6 oz (101 kg)   SpO2 95%   BMI 33.31 kg/m    Subjective:    Patient ID: Jeffrey Hunter, male    DOB: 06/06/1960, 60 y.o.   MRN: [id number]  HPI: Jeffrey Hunter is a 60 y.o. male  Chief Complaint  Patient presents with   Hypertension   Hyperlipidemia   Diabetes   Medication Refill    Patient is requesting refills on medications.    HYPERTENSION / HYPERLIPIDEMIA Satisfied with current treatment? yes Duration of hypertension: years BP monitoring frequency: not checking BP range:  BP medication side effects: no Past BP meds: amlodipine and losartan (cozaar) Duration of hyperlipidemia: years Cholesterol medication side effects: no Cholesterol supplements: none Past cholesterol medications: atorvastain (lipitor) Medication compliance: excellent compliance Aspirin: no Recent stressors: no Recurrent headaches: no Visual changes: no Palpitations: no Dyspnea: no Chest pain: no Lower extremity edema: no Dizzy/lightheaded: no  Relevant past medical, surgical, family and social history reviewed and updated as indicated. Interim medical history since our last visit reviewed. Allergies and medications reviewed and updated.  Review of Systems  Eyes:  Negative for visual disturbance.  Respiratory:  Negative for chest tightness and shortness of breath.   Cardiovascular:  Negative for chest pain, palpitations and leg swelling.  Neurological:  Negative for dizziness, light-headedness and headaches.   Per HPI unless specifically indicated above     Objective:    BP 138/85   Pulse 83   Temp 97.6 F (36.4 C) (Oral)   Wt 222 lb 9.6 oz (101 kg)   SpO2 95%   BMI 33.31 kg/m   Wt Readings from Last 3 Encounters:  02/10/21 222 lb 9.6 oz (101 kg)  11/10/20 211 lb 2 oz (95.8 kg)  08/27/20 216 lb 6.4 oz (98.2 kg)    Physical Exam Vitals and nursing note reviewed.  Constitutional:      General: He is  not in acute distress.    Appearance: Normal appearance. He is not ill-appearing, toxic-appearing or diaphoretic.  HENT:     Head: Normocephalic.     Right Ear: External ear normal.     Left Ear: External ear normal.     Nose: Nose normal. No congestion or rhinorrhea.     Mouth/Throat:     Mouth: Mucous membranes are moist.  Eyes:     General:        Right eye: No discharge.        Left eye: No discharge.     Extraocular Movements: Extraocular movements intact.     Conjunctiva/sclera: Conjunctivae normal.     Pupils: Pupils are equal, round, and reactive to light.  Cardiovascular:     Rate and Rhythm: Normal rate and regular rhythm.     Heart sounds: No murmur heard. Pulmonary:     Effort: Pulmonary effort is normal. No respiratory distress.     Breath sounds: Normal breath sounds. No wheezing, rhonchi or rales.  Abdominal:     General: Abdomen is flat. Bowel sounds are normal.  Musculoskeletal:     Cervical back: Normal range of motion and neck supple.  Skin:    General: Skin is warm and dry.     Capillary Refill: Capillary refill takes less than 2 seconds.  Neurological:     General: No focal deficit present.     Mental Status: He is alert and oriented to  person, place, and time.  Psychiatric:        Mood and Affect: Mood normal.        Behavior: Behavior normal.        Thought Content: Thought content normal.        Judgment: Judgment normal.    Results for orders placed or performed in visit on 11/10/20  Comp Met (CMET)  Result Value Ref Range   Glucose 78 65 - 99 mg/dL   BUN 22 8 - 27 mg/dL   Creatinine, Ser 1.11 0.76 - 1.27 mg/dL   eGFR 76 >59 mL/min/1.73   BUN/Creatinine Ratio 20 10 - 24   Sodium 141 134 - 144 mmol/L   Potassium 4.6 3.5 - 5.2 mmol/L   Chloride 103 96 - 106 mmol/L   CO2 24 20 - 29 mmol/L   Calcium 9.4 8.6 - 10.2 mg/dL   Total Protein 6.9 6.0 - 8.5 g/dL   Albumin 4.5 3.8 - 4.9 g/dL   Globulin, Total 2.4 1.5 - 4.5 g/dL   Albumin/Globulin  Ratio 1.9 1.2 - 2.2   Bilirubin Total 0.3 0.0 - 1.2 mg/dL   Alkaline Phosphatase 96 44 - 121 IU/L   AST 14 0 - 40 IU/L   ALT 19 0 - 44 IU/L  Lipid Profile  Result Value Ref Range   Cholesterol, Total 150 100 - 199 mg/dL   Triglycerides 171 (H) 0 - 149 mg/dL   HDL 31 (L) >39 mg/dL   VLDL Cholesterol Cal 30 5 - 40 mg/dL   LDL Chol Calc (NIH) 89 0 - 99 mg/dL   Chol/HDL Ratio 4.8 0.0 - 5.0 ratio      Assessment & Plan:   Problem List Items Addressed This Visit       Cardiovascular and Mediastinum   Essential hypertension - Primary    Chronic.  Controlled.  Continue with current medication regimen of Losartan 137m and Amlodipine 145m  Refills sent today.  Labs ordered today.  Return to clinic in 6 months for reevaluation.  Call sooner if concerns arise.        Relevant Medications   amLODipine (NORVASC) 10 MG tablet   atorvastatin (LIPITOR) 10 MG tablet   losartan (COZAAR) 100 MG tablet   Other Relevant Orders   Comp Met (CMET)   Lipid Profile     Other   Hyperlipidemia    Chronic.  Controlled.  Continue with current medication regimen on Atorvastatin 1066m Labs ordered today.  Return to clinic in 6 months for reevaluation.  Call sooner if concerns arise.        Relevant Medications   amLODipine (NORVASC) 10 MG tablet   atorvastatin (LIPITOR) 10 MG tablet   losartan (COZAAR) 100 MG tablet   Other Relevant Orders   Comp Met (CMET)   Lipid Profile     Follow up plan: Return in about 6 months (around 08/11/2021) for Physical and Fasting labs (After APRIL 19).

## 2021-02-10 NOTE — Assessment & Plan Note (Signed)
Chronic.  Controlled.  Continue with current medication regimen of Losartan 100mg  and Amlodipine 10mg .  Refills sent today.  Labs ordered today.  Return to clinic in 6 months for reevaluation.  Call sooner if concerns arise.

## 2021-02-10 NOTE — Assessment & Plan Note (Signed)
Chronic.  Controlled.  Continue with current medication regimen on Atorvastatin 10mg.  Labs ordered today.  Return to clinic in 6 months for reevaluation.  Call sooner if concerns arise.   

## 2021-02-11 LAB — LIPID PANEL
Chol/HDL Ratio: 6 ratio — ABNORMAL HIGH (ref 0.0–5.0)
Cholesterol, Total: 167 mg/dL (ref 100–199)
HDL: 28 mg/dL — ABNORMAL LOW (ref >39)
LDL Chol Calc (NIH): 89 mg/dL (ref 0–99)
Triglycerides: 300 mg/dL — ABNORMAL HIGH (ref 0–149)
VLDL Cholesterol Cal: 50 mg/dL — ABNORMAL HIGH (ref 5–40)

## 2021-02-11 LAB — COMPREHENSIVE METABOLIC PANEL
ALT: 26 IU/L (ref 0–44)
AST: 19 IU/L (ref 0–40)
Albumin/Globulin Ratio: 2.3 — ABNORMAL HIGH (ref 1.2–2.2)
Albumin: 4.4 g/dL (ref 3.8–4.9)
Alkaline Phosphatase: 90 IU/L (ref 44–121)
BUN/Creatinine Ratio: 12 (ref 10–24)
BUN: 24 mg/dL (ref 8–27)
Bilirubin Total: <0.2 mg/dL (ref 0.0–1.2)
CO2: 23 mmol/L (ref 20–29)
Calcium: 9.3 mg/dL (ref 8.6–10.2)
Chloride: 105 mmol/L (ref 96–106)
Creatinine, Ser: 2.08 mg/dL — ABNORMAL HIGH (ref 0.76–1.27)
Globulin, Total: 1.9 g/dL (ref 1.5–4.5)
Glucose: 104 mg/dL — ABNORMAL HIGH (ref 70–99)
Potassium: 4.4 mmol/L (ref 3.5–5.2)
Sodium: 141 mmol/L (ref 134–144)
Total Protein: 6.3 g/dL (ref 6.0–8.5)
eGFR: 36 mL/min/{1.73_m2} — ABNORMAL LOW (ref >59)

## 2021-02-11 NOTE — Addendum Note (Signed)
Addended by: Larae Grooms on: 02/11/2021 08:10 AM   Modules accepted: Orders

## 2021-02-11 NOTE — Progress Notes (Signed)
Please let patient know that his kidney lab work declined from last visit.  Please encourage him to drink a lot of water over the weekend and he needs to return for a lab visit on Monday.  He should not take the meloxicam over the weekend.  Once I get his labs from Monday back I will make further recommendations. Please make him a lab appointment for Monday.

## 2021-02-14 ENCOUNTER — Other Ambulatory Visit: Payer: BC Managed Care – PPO

## 2021-02-14 ENCOUNTER — Other Ambulatory Visit: Payer: Self-pay

## 2021-02-14 DIAGNOSIS — I10 Essential (primary) hypertension: Secondary | ICD-10-CM | POA: Diagnosis not present

## 2021-02-15 LAB — COMPREHENSIVE METABOLIC PANEL
ALT: 21 IU/L (ref 0–44)
AST: 17 IU/L (ref 0–40)
Albumin/Globulin Ratio: 2.2 (ref 1.2–2.2)
Albumin: 4.6 g/dL (ref 3.8–4.9)
Alkaline Phosphatase: 91 IU/L (ref 44–121)
BUN/Creatinine Ratio: 15 (ref 10–24)
BUN: 21 mg/dL (ref 8–27)
Bilirubin Total: 0.4 mg/dL (ref 0.0–1.2)
CO2: 23 mmol/L (ref 20–29)
Calcium: 9.5 mg/dL (ref 8.6–10.2)
Chloride: 104 mmol/L (ref 96–106)
Creatinine, Ser: 1.42 mg/dL — ABNORMAL HIGH (ref 0.76–1.27)
Globulin, Total: 2.1 g/dL (ref 1.5–4.5)
Glucose: 91 mg/dL (ref 70–99)
Potassium: 4.3 mmol/L (ref 3.5–5.2)
Sodium: 141 mmol/L (ref 134–144)
Total Protein: 6.7 g/dL (ref 6.0–8.5)
eGFR: 57 mL/min/{1.73_m2} — ABNORMAL LOW (ref >59)

## 2021-02-15 NOTE — Progress Notes (Signed)
Please let patient know that his kidney function improved.  Please ask him if he stopped taking Meloxicam.  If he did stop it, he should continue to not take it as well as avoid NSAIDS.  We should recheck his lab work as a nurse visit in 1 month to make sure it is still improving.  If he is still taking the Meloxicam, I recommend that he stop and that he see Nephrology.

## 2021-03-27 ENCOUNTER — Encounter: Payer: Self-pay | Admitting: Nurse Practitioner

## 2021-03-28 NOTE — Telephone Encounter (Signed)
Lmom for patient to schedule appointment. 

## 2021-03-29 NOTE — Telephone Encounter (Signed)
Appointment scheduled by [facility].

## 2021-04-05 ENCOUNTER — Encounter: Payer: Self-pay | Admitting: Nurse Practitioner

## 2021-04-05 ENCOUNTER — Ambulatory Visit: Payer: BC Managed Care – PPO | Admitting: Nurse Practitioner

## 2021-04-05 ENCOUNTER — Other Ambulatory Visit: Payer: Self-pay

## 2021-04-05 VITALS — BP 126/75 | HR 85 | Temp 98.6°F | Wt 219.0 lb

## 2021-04-05 DIAGNOSIS — R7989 Other specified abnormal findings of blood chemistry: Secondary | ICD-10-CM | POA: Diagnosis not present

## 2021-04-05 DIAGNOSIS — G8929 Other chronic pain: Secondary | ICD-10-CM | POA: Diagnosis not present

## 2021-04-05 DIAGNOSIS — M25512 Pain in left shoulder: Secondary | ICD-10-CM

## 2021-04-05 NOTE — Progress Notes (Signed)
BP 126/75   Pulse 85   Temp 98.6 F (37 C) (Oral)   Wt 219 lb (99.3 kg)   SpO2 96%   BMI 32.77 kg/m    Subjective:    Patient ID: Jeffrey Hunter, male    DOB: [birth year], 60 y.o.   MRN: [id number]  HPI: Jeffrey Hunter is a 60 y.o. male  Chief Complaint  Patient presents with   Shoulder Pain    Patient states he has been off the Meloxicam for about a month since his lab work was elevated.  Patient states he was taking the Meloxicam for shoulder pain.  States it made it more bearable.  Now the pain is worse since he has been off the medication.   Patient has already done shots in his lower back.  He had arthritis related pain. The shots improved it.    Relevant past medical, surgical, family and social history reviewed and updated as indicated. Interim medical history since our last visit reviewed. Allergies and medications reviewed and updated.  Review of Systems  Musculoskeletal:        Left shoulder pain   Per HPI unless specifically indicated above     Objective:    BP 126/75   Pulse 85   Temp 98.6 F (37 C) (Oral)   Wt 219 lb (99.3 kg)   SpO2 96%   BMI 32.77 kg/m   Wt Readings from Last 3 Encounters:  04/05/21 219 lb (99.3 kg)  02/10/21 222 lb 9.6 oz (101 kg)  11/10/20 211 lb 2 oz (95.8 kg)    Physical Exam Vitals and nursing note reviewed.  Constitutional:      General: He is not in acute distress.    Appearance: Normal appearance. He is not ill-appearing, toxic-appearing or diaphoretic.  HENT:     Head: Normocephalic.     Right Ear: External ear normal.     Left Ear: External ear normal.     Nose: Nose normal. No congestion or rhinorrhea.     Mouth/Throat:     Mouth: Mucous membranes are moist.  Eyes:     General:        Right eye: No discharge.        Left eye: No discharge.     Extraocular Movements: Extraocular movements intact.     Conjunctiva/sclera: Conjunctivae normal.     Pupils: Pupils are equal, round, and reactive to light.   Cardiovascular:     Rate and Rhythm: Normal rate and regular rhythm.     Heart sounds: No murmur heard. Pulmonary:     Effort: Pulmonary effort is normal. No respiratory distress.     Breath sounds: Normal breath sounds. No wheezing, rhonchi or rales.  Abdominal:     General: Abdomen is flat. Bowel sounds are normal.  Musculoskeletal:     Cervical back: Normal range of motion and neck supple.  Skin:    General: Skin is warm and dry.     Capillary Refill: Capillary refill takes less than 2 seconds.  Neurological:     General: No focal deficit present.     Mental Status: He is alert and oriented to person, place, and time.  Psychiatric:        Mood and Affect: Mood normal.        Behavior: Behavior normal.        Thought Content: Thought content normal.        Judgment: Judgment normal.    Results for orders  placed or performed in visit on 02/14/21  Comp Met (CMET)  Result Value Ref Range   Glucose 91 70 - 99 mg/dL   BUN 21 8 - 27 mg/dL   Creatinine, Ser 1.42 (H) 0.76 - 1.27 mg/dL   eGFR 57 (L) >59 mL/min/1.73   BUN/Creatinine Ratio 15 10 - 24   Sodium 141 134 - 144 mmol/L   Potassium 4.3 3.5 - 5.2 mmol/L   Chloride 104 96 - 106 mmol/L   CO2 23 20 - 29 mmol/L   Calcium 9.5 8.6 - 10.2 mg/dL   Total Protein 6.7 6.0 - 8.5 g/dL   Albumin 4.6 3.8 - 4.9 g/dL   Globulin, Total 2.1 1.5 - 4.5 g/dL   Albumin/Globulin Ratio 2.2 1.2 - 2.2   Bilirubin Total 0.4 0.0 - 1.2 mg/dL   Alkaline Phosphatase 91 44 - 121 IU/L   AST 17 0 - 40 IU/L   ALT 21 0 - 44 IU/L      Assessment & Plan:   Problem List Items Addressed This Visit   None Visit Diagnoses     Elevated serum creatinine    -  Primary   Repeat labs during visit today. Will make recommendations based on lab results. Recommend continuing to hold the Meloxicam.    Relevant Orders   Comp Met (CMET)   Chronic left shoulder pain       Xray of shoulder ordered during visit. Will evaluate for arthritis. Can possibly do steroid  injection if arthritis. Will make recommendations based on imaging.   Relevant Orders   DG Shoulder Left         Follow up plan: No follow-ups on file.

## 2021-04-06 LAB — COMPREHENSIVE METABOLIC PANEL
ALT: 19 IU/L (ref 0–44)
AST: 15 IU/L (ref 0–40)
Albumin/Globulin Ratio: 1.9 (ref 1.2–2.2)
Albumin: 4.5 g/dL (ref 3.8–4.9)
Alkaline Phosphatase: 107 IU/L (ref 44–121)
BUN/Creatinine Ratio: 15 (ref 10–24)
BUN: 19 mg/dL (ref 8–27)
Bilirubin Total: 0.4 mg/dL (ref 0.0–1.2)
CO2: 22 mmol/L (ref 20–29)
Calcium: 9.3 mg/dL (ref 8.6–10.2)
Chloride: 104 mmol/L (ref 96–106)
Creatinine, Ser: 1.24 mg/dL (ref 0.76–1.27)
Globulin, Total: 2.4 g/dL (ref 1.5–4.5)
Glucose: 89 mg/dL (ref 70–99)
Potassium: 4.5 mmol/L (ref 3.5–5.2)
Sodium: 142 mmol/L (ref 134–144)
Total Protein: 6.9 g/dL (ref 6.0–8.5)
eGFR: 67 mL/min/{1.73_m2} (ref >59)

## 2021-04-06 NOTE — Progress Notes (Signed)
Please let patient know that his kidney function returned to normal.  He should continue to avoid the Meloxicam and other NSAIDS such as Ibuporfen, motrin and aleve.  It is okay to take them occasionally but not daily.  Let's get the xray of his shoulder and see if there is something else we can do to help his pain.

## 2021-07-22 ENCOUNTER — Encounter: Payer: Self-pay | Admitting: Internal Medicine

## 2021-07-22 ENCOUNTER — Ambulatory Visit: Payer: BC Managed Care – PPO | Admitting: Internal Medicine

## 2021-07-22 VITALS — BP 124/75 | HR 81 | Temp 98.1°F | Ht 68.54 in | Wt 216.0 lb

## 2021-07-22 DIAGNOSIS — N5089 Other specified disorders of the male genital organs: Secondary | ICD-10-CM

## 2021-07-22 MED ORDER — SULFAMETHOXAZOLE-TRIMETHOPRIM 800-160 MG PO TABS: 1.0000 | ORAL_TABLET | Freq: Two times a day (BID) | ORAL | 0 refills | Status: AC

## 2021-07-22 MED ORDER — NAPROXEN 500 MG PO TABS: 500.0000 mg | ORAL_TABLET | Freq: Two times a day (BID) | ORAL | 0 refills | Status: AC

## 2021-07-22 NOTE — Progress Notes (Signed)
? ?BP 124/75   Pulse 81   Temp 98.1 ?F (36.7 ?C) (Oral)   Ht 5' 8.54" (1.741 m)   Wt 216 lb (98 kg)   SpO2 97%   BMI 32.32 kg/m?   ? ?Subjective:  ? ? Patient ID: Jeffrey Hunter, male    DOB: [birth year], 61 y.o.   MRN: [id number] ? ?Chief Complaint  ?Patient presents with  ?? Possible Hernia  ?  Below scrotum, Noticed about 2 days ago,   ? ? ?HPI: ?Jeffrey Hunter is a 61 y.o. male ? ?Pt was lifting heavy concrete for the well to fix it. Thinks he has a hernia pain is below his scrotum from his verbal record. Has tenderness when he is in a seated position.  ? ? ? ? ? ?Chief Complaint  ?Patient presents with  ?? Possible Hernia  ?  Below scrotum, Noticed about 2 days ago,   ? ? ?Relevant past medical, surgical, family and social history reviewed and updated as indicated. Interim medical history since our last visit reviewed. ?Allergies and medications reviewed and updated. ? ?Review of Systems ? ?Per HPI unless specifically indicated above ? ?   ?Objective:  ?  ?BP 124/75   Pulse 81   Temp 98.1 ?F (36.7 ?C) (Oral)   Ht 5' 8.54" (1.741 m)   Wt 216 lb (98 kg)   SpO2 97%   BMI 32.32 kg/m?   ?Wt Readings from Last 3 Encounters:  ?07/22/21 216 lb (98 kg)  ?04/05/21 219 lb (99.3 kg)  ?02/10/21 222 lb 9.6 oz (101 kg)  ?  ?Physical Exam ?Genitourinary: ?   Pubic Area: No rash.   ?   Comments: Pain and tenderness noted below the scrotum abotu half way between the scrotal area and the anal area ?Some erythema noted.  ?Skin: ?   Findings: Erythema present.  ?   Comments: Mildly tender area with some skin thickening noted on the perineal area 2-3 " below the scrotum well above the anal area non fluctuant no bleeding no discharge noted.   ? ? ?Results for orders placed or performed in visit on 04/05/21  ?Comp Met (CMET)  ?Result Value Ref Range  ? Glucose 89 70 - 99 mg/dL  ? BUN 19 8 - 27 mg/dL  ? Creatinine, Ser 1.24 0.76 - 1.27 mg/dL  ? eGFR 67 >59 mL/min/1.73  ? BUN/Creatinine Ratio 15 10 - 24  ? Sodium 142 134 -  144 mmol/L  ? Potassium 4.5 3.5 - 5.2 mmol/L  ? Chloride 104 96 - 106 mmol/L  ? CO2 22 20 - 29 mmol/L  ? Calcium 9.3 8.6 - 10.2 mg/dL  ? Total Protein 6.9 6.0 - 8.5 g/dL  ? Albumin 4.5 3.8 - 4.9 g/dL  ? Globulin, Total 2.4 1.5 - 4.5 g/dL  ? Albumin/Globulin Ratio 1.9 1.2 - 2.2  ? Bilirubin Total 0.4 0.0 - 1.2 mg/dL  ? Alkaline Phosphatase 107 44 - 121 IU/L  ? AST 15 0 - 40 IU/L  ? ALT 19 0 - 44 IU/L  ? ?   ? ? ?Current Outpatient Medications:  ??  amLODipine (NORVASC) 10 MG tablet, Take 1 tablet (10 mg total) by mouth daily., Disp: 90 tablet, Rfl: 1 ??  ASPIRIN 81 PO, Take by mouth as needed., Disp: , Rfl:  ??  atorvastatin (LIPITOR) 10 MG tablet, Take 1 tablet (10 mg total) by mouth daily., Disp: 90 tablet, Rfl: 1 ??  cyclobenzaprine (FLEXERIL) 10 MG tablet, Take  1 tablet (10 mg total) by mouth 3 (three) times daily as needed for muscle spasms. DO NOT DRIVE WHILE TAKING THIS MEDICINE, Disp: 90 tablet, Rfl: 1 ??  losartan (COZAAR) 100 MG tablet, Take 1 tablet (100 mg total) by mouth daily., Disp: 90 tablet, Rfl: 1 ??  naproxen (NAPROSYN) 500 MG tablet, Take 1 tablet (500 mg total) by mouth 2 (two) times daily with a meal for 14 days., Disp: 28 tablet, Rfl: 0 ??  sulfamethoxazole-trimethoprim (BACTRIM DS) 800-160 MG tablet, Take 1 tablet by mouth 2 (two) times daily for 7 days., Disp: 14 tablet, Rfl: 0 ??  cetirizine (ZYRTEC) 10 MG tablet, TAKE 1 TABLET BY MOUTH EVERY DAY (Patient not taking: Reported on 07/22/2021), Disp: 90 tablet, Rfl: 3 ??  fluticasone (FLONASE) 50 MCG/ACT nasal spray, Place 2 sprays into both nostrils daily. (Patient not taking: Reported on 07/22/2021), Disp: 16 g, Rfl: 6  ? ? ?Assessment & Plan:  ?Lump: will need to fu with gen surg ? Imaging vs drainage if turns out to be an abscess.  ? ? ?Problem List Items Addressed This Visit   ?None ?Visit Diagnoses   ? ? 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    -  Primary  ? Relevant Orders  ? Ambulatory referral to General Surgery  ? ?  ?  ? ?Orders Placed This Encounter   ?Procedures  ?? Ambulatory referral to General Surgery  ?  ? ?Meds ordered this encounter  ?Medications  ?? sulfamethoxazole-trimethoprim (BACTRIM DS) 800-160 MG tablet  ?  Sig: Take 1 tablet by mouth 2 (two) times daily for 7 days.  ?  Dispense:  14 tablet  ?  Refill:  0  ?? naproxen (NAPROSYN) 500 MG tablet  ?  Sig: Take 1 tablet (500 mg total) by mouth 2 (two) times daily with a meal for 14 days.  ?  Dispense:  28 tablet  ?  Refill:  0  ?  ? ?Follow up plan: ?No follow-ups on file. ? ?

## 2021-07-28 ENCOUNTER — Ambulatory Visit: Payer: BC Managed Care – PPO | Admitting: Surgery

## 2021-08-04 ENCOUNTER — Encounter: Payer: Self-pay | Admitting: Surgery

## 2021-08-04 ENCOUNTER — Ambulatory Visit: Payer: BC Managed Care – PPO | Admitting: Surgery

## 2021-08-04 VITALS — BP 148/88 | HR 89 | Temp 98.6°F | Ht 69.0 in | Wt 216.4 lb

## 2021-08-04 DIAGNOSIS — L02215 Cutaneous abscess of perineum: Secondary | ICD-10-CM

## 2021-08-04 MED ORDER — NYSTATIN-TRIAMCINOLONE 100000-0.1 UNIT/GM-% EX CREA: 1.0000 "application " | TOPICAL_CREAM | Freq: Two times a day (BID) | CUTANEOUS | 0 refills | Status: DC

## 2021-08-04 MED ORDER — CLINDAMYCIN HCL 300 MG PO CAPS: 300.0000 mg | ORAL_CAPSULE | Freq: Three times a day (TID) | ORAL | 0 refills | Status: AC

## 2021-08-04 NOTE — Progress Notes (Signed)
Patient ID: Jeffrey Hunter, male   DOB: 1960/12/08, 61 y.o.   MRN: [id number] ? ?Chief Complaint: Perineal lump ? ?History of Present Illness ?Jeffrey Hunter is a 61 y.o. male with a perineal lump present for about 2 weeks.  Became tender, worse with sitting.  No known drainage.  Improved while taking Bactrim.  Currently off Bactrim with residual cylindrical lesion.  Denies pain.  No known fevers or chills. ? ?Past Medical History ?Past Medical History:  ?Diagnosis Date  ? Chronic lower back pain   ? Hyperlipidemia   ? Hypertension   ?  ? ? ?Past Surgical History:  ?Procedure Laterality Date  ? vascectomy    ? ? ?No Known Allergies ? ?Current Outpatient Medications  ?Medication Sig Dispense Refill  ? amLODipine (NORVASC) 10 MG tablet Take 1 tablet (10 mg total) by mouth daily. 90 tablet 1  ? ASPIRIN 81 PO Take by mouth as needed.    ? atorvastatin (LIPITOR) 10 MG tablet Take 1 tablet (10 mg total) by mouth daily. 90 tablet 1  ? clindamycin (CLEOCIN) 300 MG capsule Take 1 capsule (300 mg total) by mouth 3 (three) times daily for 14 days. 42 capsule 0  ? cyclobenzaprine (FLEXERIL) 10 MG tablet Take 1 tablet (10 mg total) by mouth 3 (three) times daily as needed for muscle spasms. DO NOT DRIVE WHILE TAKING THIS MEDICINE 90 tablet 1  ? losartan (COZAAR) 100 MG tablet Take 1 tablet (100 mg total) by mouth daily. 90 tablet 1  ? naproxen (NAPROSYN) 500 MG tablet Take 1 tablet (500 mg total) by mouth 2 (two) times daily with a meal for 14 days. 28 tablet 0  ? nystatin-triamcinolone (MYCOLOG II) cream Apply 1 application. topically 2 (two) times daily. 30 g 0  ? ?No current facility-administered medications for this visit.  ? ? ?Family History ?Family History  ?Problem Relation Age of Onset  ? Diabetes Sister   ? Heart attack Paternal Grandfather   ?  ? ? ?Social History ?Social History  ? ?Tobacco Use  ? Smoking status: Every Day  ?  Packs/day: 0.50  ?  Years: 36.00  ?  Pack years: 18.00  ?  Types: Cigarettes  ? Smokeless  tobacco: Never  ?Vaping Use  ? Vaping Use: Never used  ?Substance Use Topics  ? Alcohol use: Yes  ?  Comment: ocassionally  ? Drug use: Never  ?  ?  ? ? ?Review of Systems  ?Constitutional: Negative.   ?HENT:  Positive for tinnitus.   ?Eyes: Negative.   ?Respiratory:  Positive for cough and stridor.   ?Cardiovascular: Negative.   ?Gastrointestinal: Negative.   ?Genitourinary: Negative.   ?Skin:  Positive for itching.  ?Neurological: Negative.   ?Psychiatric/Behavioral: Negative.    ?  ? ?Physical Exam ?Blood pressure (!) 148/88, pulse 89, temperature 98.6 ?F (37 ?C), temperature source Oral, height 5\' 9"  (1.753 m), weight 216 lb 6.4 oz (98.2 kg), SpO2 92 %. ?Last Weight  Most recent update: 08/04/2021  3:05 PM  ? ? Weight  ?98.2 kg (216 lb 6.4 oz)  ?      ? ?  ? ? ?CONSTITUTIONAL: Well developed, and nourished, appropriately responsive and aware without distress.   ?EYES: Sclera non-icteric.   ?EARS, NOSE, MOUTH AND THROAT: The oropharynx is clear. Oral mucosa is pink and moist.    Hearing is intact to voice.  ?NECK: Trachea is midline, and there is no jugular venous distension.  ?LYMPH NODES:  Lymph  nodes in the neck are not enlarged. ?RESPIRATORY:  Lungs are clear, and breath sounds are equal bilaterally. Normal respiratory effort without pathologic use of accessory muscles. ?CARDIOVASCULAR: Heart is regular in rate and rhythm. ?GI: The abdomen is soft, nontender, and nondistended. There were no palpable masses. I did not appreciate hepatosplenomegaly. There were normal bowel sounds. ?GU: The perineal area just to the patient's left of the median raphae about midway between the posterior scrotum and the perianal area there is a cylindrical density that with gentle pressure drained a few drops of purulent appearing fluid.  There did not appear to be any residual present, it was not remarkably tender surprisingly so.  It was liquid, nonviscous.  There is no overlying erythema or adjacent induration. ?MUSCULOSKELETAL:   Symmetrical muscle tone appreciated in all four extremities.    ?SKIN: Skin turgor is normal. No pathologic skin lesions appreciated.  ?NEUROLOGIC:  Motor and sensation appear grossly normal.  Cranial nerves are grossly without defect. ?PSYCH:  Alert and oriented to person, place and time. Affect is appropriate for situation. ? ?Data Reviewed ?I have personally reviewed what is currently available of the patient's imaging, recent labs and medical records.   ?Labs:  ? ?  Latest Ref Rng & Units 08/10/2020  ?  9:40 AM 06/19/2019  ?  3:03 PM  ?CBC  ?WBC 3.4 - 10.8 x10E3/uL 8.0   7.6    ?Hemoglobin 13.0 - 17.7 g/dL 70.7   86.7    ?Hematocrit 37.5 - 51.0 % 47.7   49.8    ?Platelets 150 - 450 x10E3/uL 183   200    ? ? ?  Latest Ref Rng & Units 04/05/2021  ?  9:13 AM 02/14/2021  ?  8:38 AM 02/10/2021  ?  4:12 PM  ?CMP  ?Glucose 70 - 99 mg/dL 89   91   544    ?BUN 8 - 27 mg/dL 19   21   24     ?Creatinine 0.76 - 1.27 mg/dL   9.20   1.00    ?Sodium 134 - 144 mmol/L 142   141   141    ?Potassium 3.5 - 5.2 mmol/L 4.5   4.3   4.4    ?Chloride 96 - 106 mmol/L 104   104   105    ?CO2 20 - 29 mmol/L 22   23   23     ?Calcium 8.6 - 10.2 mg/dL 9.3   9.5   9.3    ?Total Protein 6.0 - 8.5 g/dL 6.9   6.7   6.3    ?Total Bilirubin 0.0 - 1.2 mg/dL 0.4   0.4   7.12    ?Alkaline Phos 44 - 121 IU/L 107   91   90    ?AST 0 - 40 IU/L 15   17   19     ?ALT 0 - 44 IU/L 19   21   26     ? ? ? ? ?Imaging: ? ?Within last 24 hrs: No results found. ? ?Assessment ?   ?Perineal abscess, with some spontaneous evidence of drainage. ?Patient Active Problem List  ? Diagnosis Date Noted  ? Chronic low back pain 04/19/2020  ? Obesity 04/19/2020  ? Nicotine dependence, cigarettes, uncomplicated 03/14/2020  ? Seasonal allergic rhinitis 01/31/2020  ? Hyperlipidemia 12/18/2019  ? Essential hypertension 12/18/2019  ? ? ?Plan ?   ?Advised utilization of warm compresses/hot showering, sitz bath's with associated p.o. clindamycin.  I am suspicious the small  lesion  will improve and perhaps resolve or scarred down given time.  We will anticipate a 2-week course of this to continue.  And follow-up in about 3 weeks to see if the lesion persists.  I suspect it has will have a high risk of recurrence considering the location, and hoping to avoid adding additional scar to this area.  He also noted some excessive flaking of the scrotal skin and it appeared to be a bit hyperemic today.  So we will also try the triamcinolone nystatin cream to this area as well. ? ?Face-to-face time spent with the patient and accompanying care providers(if present) was 30 minutes, with more than 50% of the time spent counseling, educating, and coordinating care of the patient.   ? ?These notes generated with voice recognition software. I apologize for typographical errors. ? ?Campbell Lernerenny Beryle Bagsby M.D., FACS ?08/04/2021, 3:44 PM ? ? ? ? ?

## 2021-08-04 NOTE — Patient Instructions (Addendum)
You may apply a rice pack or warm compress to the area or a hot bath to help it drain fully. We have sent in an antibiotic for you to take for more coverage as the area still has some fluid in it.  ? ?We have also sent in a cream for you to try for a couple of weeks. ? ?Follow up here in 3 weeks.  ? ?Epidermoid Cyst ?An epidermoid cyst, also called an epidermal cyst, is a small lump under your skin. The cyst contains a substance called keratin. Do not try to pop or open the cyst yourself. ?What are the causes? ?A blocked hair follicle. ?A hair that curls and re-enters the skin instead of growing straight out of the skin. ?A blocked pore. ?Irritated skin. ?An injury to the skin. ?Certain conditions that are passed along from parent to child. ?Human papillomavirus (HPV). This happens rarely when cysts occur on the bottom of the feet. ?Long-term sun damage to the skin. ?What increases the risk? ?Having acne. ?Being male. ?Having an injury to the skin. ?Being past puberty. ?Having certain conditions caused by genes (genetic disorder) ?What are the signs or symptoms? ?These cysts are usually harmless, but they can get infected. Symptoms of infection may include: ?Redness. ?Inflammation. ?Tenderness. ?Warmth. ?Fever. ?A bad-smelling substance that drains from the cyst. ?Pus that drains from the cyst. ?How is this treated? ?In many cases, epidermoid cysts go away on their own without treatment. If a cyst becomes infected, treatment may include: ?Opening and draining the cyst, done by a doctor. After draining, you may need minor surgery to remove the rest of the cyst. ?Antibiotic medicine. ?Shots of medicines (steroids) that help to reduce inflammation. ?Surgery to remove the cyst. Surgery may be done if the cyst: ?Becomes large. ?Bothers you. ?Has a chance of turning into cancer. ?Do not try to open a cyst yourself. ?Follow these instructions at home: ?Medicines ?Take over-the-counter and prescription medicines as told by  your doctor. ?If you were prescribed an antibiotic medicine, take it as told by your doctor. Do not stop taking it even if you start to feel better. ?General instructions ?Keep the area around your cyst clean and dry. ?Wear loose, dry clothing. ?Avoid touching your cyst. ?Check your cyst every day for signs of infection. Check for: ?Redness, swelling, or pain. ?Fluid or blood. ?Warmth. ?Pus or a bad smell. ?Keep all follow-up visits. ?How is this prevented? ?Wear clean, dry, clothing. ?Avoid wearing tight clothing. ?Keep your skin clean and dry. Take showers or baths every day. ?Contact a doctor if: ?Your cyst has symptoms of infection. ?Your condition does not improve or gets worse. ?You have a cyst that looks different from other cysts you have had. ?You have a fever. ?Get help right away if: ?Redness spreads from the cyst into the area close by. ?Summary ?An epidermoid cyst is a small lump under your skin. ?If a cyst becomes infected, treatment may include surgery to open and drain the cyst, or to remove it. ?Take over-the-counter and prescription medicines only as told by your doctor. ?Contact a doctor if your condition is not improving or is getting worse. ?Keep all follow-up visits. ?This information is not intended to replace advice given to you by your health care provider. Make sure you discuss any questions you have with your health care provider. ?Document Revised: 07/16/2019 Document Reviewed: 07/16/2019 ?Elsevier Patient Education ? 2022 [employer] ? ?

## 2021-08-11 ENCOUNTER — Encounter: Payer: BC Managed Care – PPO | Admitting: Nurse Practitioner

## 2021-08-25 ENCOUNTER — Ambulatory Visit: Payer: BC Managed Care – PPO | Admitting: Surgery

## 2021-09-05 NOTE — Progress Notes (Signed)
? ?BP 134/79 (BP Location: Left Arm)   Pulse 81   Temp 98.7 ?F (37.1 ?C) (Oral)   Ht 5' 8.6" (1.742 m)   Wt 213 lb 9.6 oz (96.9 kg)   SpO2 98%   BMI 31.91 kg/m?   ? ?Subjective:  ? ? Patient ID: Jeffrey Hunter, male    DOB: 1961-04-06, 61 y.o.   MRN: [id number] ? ?HPI: ?Jeffrey Hunter is a 61 y.o. male presenting on 09/06/2021 for comprehensive medical examination. Current medical complaints include:none ? ?He currently lives with: ?Interim Problems from his last visit: no ? ?HYPERTENSION / HYPERLIPIDEMIA ?Satisfied with current treatment? no ?Duration of hypertension: years ?BP monitoring frequency: not checking ?BP range:  ?BP medication side effects: no ?Past BP meds: amlodipine ?Duration of hyperlipidemia: years ?Cholesterol medication side effects: no ?Cholesterol supplements: none ?Past cholesterol medications: atorvastain (lipitor) ?Medication compliance: excellent compliance ?Aspirin: no ?Recent stressors: no ?Recurrent headaches: no ?Visual changes: no ?Palpitations: no ?Dyspnea: no ?Chest pain: no ?Lower extremity edema: no ?Dizzy/lightheaded: no ? ? ?Depression Screen done today and results listed below:  ? ?  09/06/2021  ?  8:15 AM 04/05/2021  ?  8:03 AM 08/10/2020  ?  8:40 AM 06/19/2019  ?  2:12 PM  ?Depression screen PHQ 2/9  ?Decreased Interest 0 0 0 0  ?Down, Depressed, Hopeless 0 0 0 0  ?PHQ - 2 Score 0 0 0 0  ?Altered sleeping 0 0  0  ?Tired, decreased energy 0 0  1  ?Change in appetite 0 0  0  ?Feeling bad or failure about yourself  0 0  0  ?Trouble concentrating 0 0  0  ?Moving slowly or fidgety/restless 0 0  0  ?Suicidal thoughts 0 0  0  ?PHQ-9 Score 0 0  1  ?Difficult doing work/chores Not difficult at all Not difficult at all    ? ? ?The patient does not have a history of falls. I did complete a risk assessment for falls. A plan of care for falls was documented. ? ? ?Past Medical History:  ?Past Medical History:  ?Diagnosis Date  ? Chronic lower back pain   ? Hyperlipidemia   ?  Hypertension   ? ? ?Surgical History:  ?Past Surgical History:  ?Procedure Laterality Date  ? vascectomy    ? ? ?Medications:  ?Current Outpatient Medications on File Prior to Visit  ?Medication Sig  ? ASPIRIN 81 PO Take by mouth as needed.  ? cyclobenzaprine (FLEXERIL) 10 MG tablet Take 1 tablet (10 mg total) by mouth 3 (three) times daily as needed for muscle spasms. DO NOT DRIVE WHILE TAKING THIS MEDICINE (Patient not taking: Reported on 09/06/2021)  ? ?No current facility-administered medications on file prior to visit.  ? ? ?Allergies:  ?No Known Allergies ? ?Social History:  ?Social History  ? ?Socioeconomic History  ? Marital status: Married  ?  Spouse name: Not on file  ? Number of children: Not on file  ? Years of education: Not on file  ? Highest education level: Not on file  ?Occupational History  ? Not on file  ?Tobacco Use  ? Smoking status: Every Day  ?  Packs/day: 0.50  ?  Years: 36.00  ?  Pack years: 18.00  ?  Types: Cigarettes  ? Smokeless tobacco: Never  ?Vaping Use  ? Vaping Use: Never used  ?Substance and Sexual Activity  ? Alcohol use: Yes  ?  Comment: ocassionally  ? Drug use: Never  ? Sexual  activity: Yes  ?Other Topics Concern  ? Not on file  ?Social History Narrative  ? Not on file  ? ?Social Determinants of Health  ? ?Financial Resource Strain: Not on file  ?Food Insecurity: Not on file  ?Transportation Needs: Not on file  ?Physical Activity: Not on file  ?Stress: Not on file  ?Social Connections: Not on file  ?Intimate Partner Violence: Not on file  ? ?Social History  ? ?Tobacco Use  ?Smoking Status Every Day  ? Packs/day: 0.50  ? Years: 36.00  ? Pack years: 18.00  ? Types: Cigarettes  ?Smokeless Tobacco Never  ? ?Social History  ? ?Substance and Sexual Activity  ?Alcohol Use Yes  ? Comment: ocassionally  ? ? ?Family History:  ?Family History  ?Problem Relation Age of Onset  ? Diabetes Sister   ? Heart attack Paternal Grandfather   ? ? ?Past medical history, surgical history, medications,  allergies, family history and social history reviewed with patient today and changes made to appropriate areas of the chart.  ? ?Review of Systems  ?Eyes:  Negative for blurred vision and double vision.  ?Respiratory:  Negative for shortness of breath.   ?Cardiovascular:  Negative for chest pain, palpitations and leg swelling.  ?Neurological:  Negative for dizziness and headaches.  ?All other ROS negative except what is listed above and in the HPI.  ? ?   ?Objective:  ?  ?BP 134/79 (BP Location: Left Arm)   Pulse 81   Temp 98.7 ?F (37.1 ?C) (Oral)   Ht 5' 8.6" (1.742 m)   Wt 213 lb 9.6 oz (96.9 kg)   SpO2 98%   BMI 31.91 kg/m?   ?Wt Readings from Last 3 Encounters:  ?09/06/21 213 lb 9.6 oz (96.9 kg)  ?08/04/21 216 lb 6.4 oz (98.2 kg)  ?07/22/21 216 lb (98 kg)  ?  ?Physical Exam ?Vitals and nursing note reviewed.  ?Constitutional:   ?   General: He is not in acute distress. ?   Appearance: Normal appearance. He is obese. He is not ill-appearing, toxic-appearing or diaphoretic.  ?HENT:  ?   Head: Normocephalic.  ?   Right Ear: Tympanic membrane, ear canal and external ear normal.  ?   Left Ear: Tympanic membrane, ear canal and external ear normal.  ?   Nose: Nose normal. No congestion or rhinorrhea.  ?   Mouth/Throat:  ?   Mouth: Mucous membranes are moist.  ?Eyes:  ?   General:     ?   Right eye: No discharge.     ?   Left eye: No discharge.  ?   Extraocular Movements: Extraocular movements intact.  ?   Conjunctiva/sclera: Conjunctivae normal.  ?   Pupils: Pupils are equal, round, and reactive to light.  ?Cardiovascular:  ?   Rate and Rhythm: Normal rate and regular rhythm.  ?   Heart sounds: No murmur heard. ?Pulmonary:  ?   Effort: Pulmonary effort is normal. No respiratory distress.  ?   Breath sounds: Normal breath sounds. No wheezing, rhonchi or rales.  ?Abdominal:  ?   General: Abdomen is flat. Bowel sounds are normal. There is no distension.  ?   Palpations: Abdomen is soft.  ?   Tenderness: There is no  abdominal tenderness. There is no guarding.  ?Musculoskeletal:  ?   Cervical back: Normal range of motion and neck supple.  ?Skin: ?   General: Skin is warm and dry.  ?   Capillary Refill: Capillary refill  takes less than 2 seconds.  ?Neurological:  ?   General: No focal deficit present.  ?   Mental Status: He is alert and oriented to person, place, and time.  ?   Cranial Nerves: No cranial nerve deficit.  ?   Motor: No weakness.  ?   Deep Tendon Reflexes: Reflexes normal.  ?Psychiatric:     ?   Mood and Affect: Mood normal.     ?   Behavior: Behavior normal.     ?   Thought Content: Thought content normal.     ?   Judgment: Judgment normal.  ? ? ?Results for orders placed or performed in visit on 04/05/21  ?Comp Met (CMET)  ?Result Value Ref Range  ? Glucose 89 70 - 99 mg/dL  ? BUN 19 8 - 27 mg/dL  ? Creatinine, Ser 1.24 0.76 - 1.27 mg/dL  ? eGFR 67 >59 mL/min/1.73  ? BUN/Creatinine Ratio 15 10 - 24  ? Sodium 142 134 - 144 mmol/L  ? Potassium 4.5 3.5 - 5.2 mmol/L  ? Chloride 104 96 - 106 mmol/L  ? CO2 22 20 - 29 mmol/L  ? Calcium 9.3 8.6 - 10.2 mg/dL  ? Total Protein 6.9 6.0 - 8.5 g/dL  ? Albumin 4.5 3.8 - 4.9 g/dL  ? Globulin, Total 2.4 1.5 - 4.5 g/dL  ? Albumin/Globulin Ratio 1.9 1.2 - 2.2  ? Bilirubin Total 0.4 0.0 - 1.2 mg/dL  ? Alkaline Phosphatase 107 44 - 121 IU/L  ? AST 15 0 - 40 IU/L  ? ALT 19 0 - 44 IU/L  ? ?   ?Assessment & Plan:  ? ?Problem List Items Addressed This Visit   ? ?  ? Cardiovascular and Mediastinum  ? Essential hypertension  ?  Chronic.  Controlled.  Continue with current medication regimen of Amlodipine 32m and Losartan 1058mdaily.  Refills sent today.  Labs ordered today.  Return to clinic in 6 months for reevaluation.  Call sooner if concerns arise.  ? ? ?  ?  ? Relevant Medications  ? amLODipine (NORVASC) 10 MG tablet  ? atorvastatin (LIPITOR) 10 MG tablet  ? losartan (COZAAR) 100 MG tablet  ? Other Relevant Orders  ? Comprehensive metabolic panel  ?  ? Other  ? Hyperlipidemia  ?   Chronic.  Controlled.  Continue with current medication regimen of Atorvastatin 1084maily.  Refills sent today.  Labs ordered today.  Return to clinic in 6 months for reevaluation.  Call sooner if concerns arise.  ? ? ?  ?  ?

## 2021-09-06 ENCOUNTER — Ambulatory Visit (INDEPENDENT_AMBULATORY_CARE_PROVIDER_SITE_OTHER): Payer: BC Managed Care – PPO | Admitting: Nurse Practitioner

## 2021-09-06 ENCOUNTER — Encounter: Payer: Self-pay | Admitting: Nurse Practitioner

## 2021-09-06 VITALS — BP 134/79 | HR 81 | Temp 98.7°F | Ht 68.6 in | Wt 213.6 lb

## 2021-09-06 DIAGNOSIS — E7800 Pure hypercholesterolemia, unspecified: Secondary | ICD-10-CM

## 2021-09-06 DIAGNOSIS — Z0000 Encounter for general adult medical examination without abnormal findings: Secondary | ICD-10-CM

## 2021-09-06 DIAGNOSIS — I10 Essential (primary) hypertension: Secondary | ICD-10-CM | POA: Diagnosis not present

## 2021-09-06 LAB — URINALYSIS, ROUTINE W REFLEX MICROSCOPIC
Bilirubin, UA: NEGATIVE
Glucose, UA: NEGATIVE
Ketones, UA: NEGATIVE
Leukocytes,UA: NEGATIVE
Nitrite, UA: NEGATIVE
Protein,UA: NEGATIVE
Specific Gravity, UA: 1.025 (ref 1.005–1.030)
Urobilinogen, Ur: 0.2 mg/dL (ref 0.2–1.0)
pH, UA: 6 (ref 5.0–7.5)

## 2021-09-06 LAB — MICROSCOPIC EXAMINATION
Bacteria, UA: NONE SEEN
WBC, UA: NONE SEEN /hpf (ref 0–5)

## 2021-09-06 MED ORDER — ATORVASTATIN CALCIUM 10 MG PO TABS: 10.0000 mg | ORAL_TABLET | Freq: Every day | ORAL | 1 refills | Status: DC

## 2021-09-06 MED ORDER — LOSARTAN POTASSIUM 100 MG PO TABS: 100.0000 mg | ORAL_TABLET | Freq: Every day | ORAL | 1 refills | Status: DC

## 2021-09-06 MED ORDER — AMLODIPINE BESYLATE 10 MG PO TABS: 10.0000 mg | ORAL_TABLET | Freq: Every day | ORAL | 1 refills | Status: DC

## 2021-09-06 NOTE — Assessment & Plan Note (Signed)
Chronic.  Controlled.  Continue with current medication regimen of Amlodipine 10mg and Losartan 100mg daily.  Refills sent today.  Labs ordered today.  Return to clinic in 6 months for reevaluation.  Call sooner if concerns arise.   

## 2021-09-06 NOTE — Assessment & Plan Note (Signed)
Chronic.  Controlled.  Continue with current medication regimen of Atorvastatin 10mg  daily.  Refills sent today.  Labs ordered today.  Return to clinic in 6 months for reevaluation.  Call sooner if concerns arise.  ? ?

## 2021-09-07 LAB — CBC WITH DIFFERENTIAL/PLATELET
Basophils Absolute: 0 10*3/uL (ref 0.0–0.2)
Basos: 1 %
EOS (ABSOLUTE): 0.2 10*3/uL (ref 0.0–0.4)
Eos: 4 %
Hematocrit: 49.2 % (ref 37.5–51.0)
Hemoglobin: 16.6 g/dL (ref 13.0–17.7)
Immature Grans (Abs): 0 10*3/uL (ref 0.0–0.1)
Immature Granulocytes: 1 %
Lymphocytes Absolute: 1.1 10*3/uL (ref 0.7–3.1)
Lymphs: 17 %
MCH: 28.9 pg (ref 26.6–33.0)
MCHC: 33.7 g/dL (ref 31.5–35.7)
MCV: 86 fL (ref 79–97)
Monocytes Absolute: 0.5 10*3/uL (ref 0.1–0.9)
Monocytes: 8 %
Neutrophils Absolute: 4.7 10*3/uL (ref 1.4–7.0)
Neutrophils: 69 %
Platelets: 208 10*3/uL (ref 150–450)
RBC: 5.74 x10E6/uL (ref 4.14–5.80)
RDW: 14.3 % (ref 11.6–15.4)
WBC: 6.6 10*3/uL (ref 3.4–10.8)

## 2021-09-07 LAB — LIPID PANEL
Chol/HDL Ratio: 4.8 ratio (ref 0.0–5.0)
Cholesterol, Total: 158 mg/dL (ref 100–199)
HDL: 33 mg/dL — ABNORMAL LOW (ref >39)
LDL Chol Calc (NIH): 93 mg/dL (ref 0–99)
Triglycerides: 184 mg/dL — ABNORMAL HIGH (ref 0–149)
VLDL Cholesterol Cal: 32 mg/dL (ref 5–40)

## 2021-09-07 LAB — COMPREHENSIVE METABOLIC PANEL
ALT: 21 IU/L (ref 0–44)
AST: 14 IU/L (ref 0–40)
Albumin/Globulin Ratio: 2 (ref 1.2–2.2)
Albumin: 4.7 g/dL (ref 3.8–4.8)
Alkaline Phosphatase: 106 IU/L (ref 44–121)
BUN/Creatinine Ratio: 16 (ref 10–24)
BUN: 18 mg/dL (ref 8–27)
Bilirubin Total: 0.2 mg/dL (ref 0.0–1.2)
CO2: 22 mmol/L (ref 20–29)
Calcium: 9.5 mg/dL (ref 8.6–10.2)
Chloride: 103 mmol/L (ref 96–106)
Creatinine, Ser: 1.15 mg/dL (ref 0.76–1.27)
Globulin, Total: 2.3 g/dL (ref 1.5–4.5)
Glucose: 100 mg/dL — ABNORMAL HIGH (ref 70–99)
Potassium: 4.5 mmol/L (ref 3.5–5.2)
Sodium: 140 mmol/L (ref 134–144)
Total Protein: 7 g/dL (ref 6.0–8.5)
eGFR: 72 mL/min/{1.73_m2} (ref >59)

## 2021-09-07 LAB — PSA: Prostate Specific Ag, Serum: 0.4 ng/mL (ref 0.0–4.0)

## 2021-09-07 LAB — TSH: TSH: 0.943 u[IU]/mL (ref 0.450–4.500)

## 2021-09-07 NOTE — Progress Notes (Signed)
HI Jeffrey Hunter.  It was nice to see you yesterday.  Your lab work looks good.  Your triglycerides improved from prior.  However, they are still elevated.  I recommend decreasing processed foods and refined sugar intake.  Continue with your current medication regimen.  Follow up as discussed.  Please let me know if you have any questions.  ?

## 2022-02-09 ENCOUNTER — Other Ambulatory Visit: Payer: Self-pay | Admitting: Nurse Practitioner

## 2022-02-09 ENCOUNTER — Encounter: Payer: Self-pay | Admitting: Nurse Practitioner

## 2022-02-09 DIAGNOSIS — J302 Other seasonal allergic rhinitis: Secondary | ICD-10-CM

## 2022-02-09 NOTE — Telephone Encounter (Signed)
Requested medication (s) are due for refill today -no  Requested medication (s) are on the active medication list -no  Future visit scheduled -yes  Last refill: 02/07/21  Notes to clinic: medication no longer on current list-sent for PCP review of request  Requested Prescriptions  Pending Prescriptions Disp Refills   cetirizine (ZYRTEC) 10 MG tablet [Pharmacy Med Name: CETIRIZINE HCL 10 MG TABLET] 90 tablet 3    Sig: TAKE 1 TABLET BY MOUTH EVERY DAY     Ear, Nose, and Throat:  Antihistamines 2 Passed - 02/09/2022  9:38 AM      Passed - Cr in normal range and within 360 days    Creatinine, Ser  Date Value Ref Range Status  09/06/2021 1.15 0.76 - 1.27 mg/dL Final         Passed - Valid encounter within last 12 months    Recent Outpatient Visits           5 months ago Annual physical exam   [facility] Jon Billings, NP   6 months ago 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   [place], MD   10 months ago Elevated serum creatinine   [place], NP   12 months ago Essential hypertension   [facility] Jon Billings, NP   1 year ago Essential hypertension   [facility] Jon Billings, NP       Future Appointments             In 4 weeks Jon Billings, NP [facility]               Requested Prescriptions  Pending Prescriptions Disp Refills   cetirizine (ZYRTEC) 10 MG tablet [Pharmacy Med Name: CETIRIZINE HCL 10 MG TABLET] 90 tablet 3    Sig: TAKE 1 TABLET BY MOUTH EVERY DAY     Ear, Nose, and Throat:  Antihistamines 2 Passed - 02/09/2022  9:38 AM      Passed - Cr in normal range and within 360 days    Creatinine, Ser  Date Value Ref Range Status  09/06/2021 1.15 0.76 - 1.27 mg/dL Final         Passed - Valid encounter within last 12 months    Recent Outpatient Visits           5 months ago Annual physical exam   [place], NP   6 months ago 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

## 2022-03-08 NOTE — Progress Notes (Unsigned)
There were no vitals taken for this visit.   Subjective:    Patient ID: Jeffrey Hunter, male    DOB: 1961/03/15, 61 y.o.   MRN: [id number]  HPI: Jeffrey Hunter is a 61 y.o. male  No chief complaint on file.  HYPERTENSION / HYPERLIPIDEMIA Satisfied with current treatment? yes Duration of hypertension: years BP monitoring frequency: not checking BP range:  BP medication side effects: no Past BP meds: amlodipine and losartan (cozaar) Duration of hyperlipidemia: years Cholesterol medication side effects: no Cholesterol supplements: none Past cholesterol medications: atorvastain (lipitor) Medication compliance: excellent compliance Aspirin: no Recent stressors: no Recurrent headaches: no Visual changes: no Palpitations: no Dyspnea: no Chest pain: no Lower extremity edema: no Dizzy/lightheaded: no  Relevant past medical, surgical, family and social history reviewed and updated as indicated. Interim medical history since our last visit reviewed. Allergies and medications reviewed and updated.  Review of Systems  Eyes:  Negative for visual disturbance.  Respiratory:  Negative for chest tightness and shortness of breath.   Cardiovascular:  Negative for chest pain, palpitations and leg swelling.  Neurological:  Negative for dizziness, light-headedness and headaches.    Per HPI unless specifically indicated above     Objective:    There were no vitals taken for this visit.  Wt Readings from Last 3 Encounters:  09/06/21 213 lb 9.6 oz (96.9 kg)  08/04/21 216 lb 6.4 oz (98.2 kg)  07/22/21 216 lb (98 kg)    Physical Exam Vitals and nursing note reviewed.  Constitutional:      General: He is not in acute distress.    Appearance: Normal appearance. He is not ill-appearing, toxic-appearing or diaphoretic.  HENT:     Head: Normocephalic.     Right Ear: External ear normal.     Left Ear: External ear normal.     Nose: Nose normal. No congestion or rhinorrhea.      Mouth/Throat:     Mouth: Mucous membranes are moist.  Eyes:     General:        Right eye: No discharge.        Left eye: No discharge.     Extraocular Movements: Extraocular movements intact.     Conjunctiva/sclera: Conjunctivae normal.     Pupils: Pupils are equal, round, and reactive to light.  Cardiovascular:     Rate and Rhythm: Normal rate and regular rhythm.     Heart sounds: No murmur heard. Pulmonary:     Effort: Pulmonary effort is normal. No respiratory distress.     Breath sounds: Normal breath sounds. No wheezing, rhonchi or rales.  Abdominal:     General: Abdomen is flat. Bowel sounds are normal.  Musculoskeletal:     Cervical back: Normal range of motion and neck supple.  Skin:    General: Skin is warm and dry.     Capillary Refill: Capillary refill takes less than 2 seconds.  Neurological:     General: No focal deficit present.     Mental Status: He is alert and oriented to person, place, and time.  Psychiatric:        Mood and Affect: Mood normal.        Behavior: Behavior normal.        Thought Content: Thought content normal.        Judgment: Judgment normal.     Results for orders placed or performed in visit on 09/06/21  Microscopic Examination   Urine  Result Value Ref Range  WBC, UA None seen 0 - 5 /hpf   RBC, Urine 0-2 0 - 2 /hpf   Epithelial Cells (non renal) 0-10 0 - 10 /hpf   Crystals Present (A) N/A   Crystal Type Amorphous Sediment N/A   Mucus, UA Present (A) Not Estab.   Bacteria, UA None seen None seen/Few  TSH  Result Value Ref Range   TSH 0.943 0.450 - 4.500 uIU/mL  PSA  Result Value Ref Range   Prostate Specific Ag, Serum 0.4 0.0 - 4.0 ng/mL  Lipid panel  Result Value Ref Range   Cholesterol, Total 158 100 - 199 mg/dL   Triglycerides 184 (H) 0 - 149 mg/dL   HDL 33 (L) >39 mg/dL   VLDL Cholesterol Cal 32 5 - 40 mg/dL   LDL Chol Calc (NIH) 93 0 - 99 mg/dL   Chol/HDL Ratio 4.8 0.0 - 5.0 ratio  CBC with Differential/Platelet   Result Value Ref Range   WBC 6.6 3.4 - 10.8 x10E3/uL   RBC 5.74 4.14 - 5.80 x10E6/uL   Hemoglobin 16.6 13.0 - 17.7 g/dL   Hematocrit 49.2 37.5 - 51.0 %   MCV 86 79 - 97 fL   MCH 28.9 26.6 - 33.0 pg   MCHC 33.7 31.5 - 35.7 g/dL   RDW 14.3 11.6 - 15.4 %   Platelets 208 150 - 450 x10E3/uL   Neutrophils 69 Not Estab. %   Lymphs 17 Not Estab. %   Monocytes 8 Not Estab. %   Eos 4 Not Estab. %   Basos 1 Not Estab. %   Neutrophils Absolute 4.7 1.4 - 7.0 x10E3/uL   Lymphocytes Absolute 1.1 0.7 - 3.1 x10E3/uL   Monocytes Absolute 0.5 0.1 - 0.9 x10E3/uL   EOS (ABSOLUTE) 0.2 0.0 - 0.4 x10E3/uL   Basophils Absolute 0.0 0.0 - 0.2 x10E3/uL   Immature Granulocytes 1 Not Estab. %   Immature Grans (Abs) 0.0 0.0 - 0.1 x10E3/uL  Comprehensive metabolic panel  Result Value Ref Range   Glucose 100 (H) 70 - 99 mg/dL   BUN 18 8 - 27 mg/dL   Creatinine, Ser 1.15 0.76 - 1.27 mg/dL   eGFR 72 >59 mL/min/1.73   BUN/Creatinine Ratio 16 10 - 24   Sodium 140 134 - 144 mmol/L   Potassium 4.5 3.5 - 5.2 mmol/L   Chloride 103 96 - 106 mmol/L   CO2 22 20 - 29 mmol/L   Calcium 9.5 8.6 - 10.2 mg/dL   Total Protein 7.0 6.0 - 8.5 g/dL   Albumin 4.7 3.8 - 4.8 g/dL   Globulin, Total 2.3 1.5 - 4.5 g/dL   Albumin/Globulin Ratio 2.0 1.2 - 2.2   Bilirubin Total 0.2 0.0 - 1.2 mg/dL   Alkaline Phosphatase 106 44 - 121 IU/L   AST 14 0 - 40 IU/L   ALT 21 0 - 44 IU/L  Urinalysis, Routine w reflex microscopic  Result Value Ref Range   Specific Gravity, UA 1.025 1.005 - 1.030   pH, UA 6.0 5.0 - 7.5   Color, UA Yellow Yellow   Appearance Ur Clear Clear   Leukocytes,UA Negative Negative   Protein,UA Negative Negative/Trace   Glucose, UA Negative Negative   Ketones, UA Negative Negative   RBC, UA Trace (A) Negative   Bilirubin, UA Negative Negative   Urobilinogen, Ur 0.2 0.2 - 1.0 mg/dL   Nitrite, UA Negative Negative   Microscopic Examination See below:       Assessment & Plan:   Problem List Items  Addressed  This Visit       Cardiovascular and Mediastinum   Essential hypertension - Primary     Other   Hyperlipidemia     Follow up plan: No follow-ups on file.

## 2022-03-09 ENCOUNTER — Encounter: Payer: Self-pay | Admitting: Nurse Practitioner

## 2022-03-09 ENCOUNTER — Ambulatory Visit: Payer: BC Managed Care – PPO | Admitting: Nurse Practitioner

## 2022-03-09 VITALS — BP 121/78 | HR 75 | Temp 98.6°F | Wt 216.7 lb

## 2022-03-09 DIAGNOSIS — R7309 Other abnormal glucose: Secondary | ICD-10-CM

## 2022-03-09 DIAGNOSIS — I10 Essential (primary) hypertension: Secondary | ICD-10-CM | POA: Diagnosis not present

## 2022-03-09 DIAGNOSIS — Z6830 Body mass index (BMI) 30.0-30.9, adult: Secondary | ICD-10-CM

## 2022-03-09 DIAGNOSIS — E6609 Other obesity due to excess calories: Secondary | ICD-10-CM | POA: Diagnosis not present

## 2022-03-09 DIAGNOSIS — E7800 Pure hypercholesterolemia, unspecified: Secondary | ICD-10-CM | POA: Diagnosis not present

## 2022-03-09 MED ORDER — ATORVASTATIN CALCIUM 10 MG PO TABS: 10.0000 mg | ORAL_TABLET | Freq: Every day | ORAL | 1 refills | Status: DC

## 2022-03-09 MED ORDER — LOSARTAN POTASSIUM 100 MG PO TABS: 100.0000 mg | ORAL_TABLET | Freq: Every day | ORAL | 1 refills | Status: DC

## 2022-03-09 MED ORDER — AMLODIPINE BESYLATE 10 MG PO TABS: 10.0000 mg | ORAL_TABLET | Freq: Every day | ORAL | 1 refills | Status: DC

## 2022-03-09 NOTE — Assessment & Plan Note (Signed)
Chronic.  Controlled.  Continue with current medication regimen of Losartan and Amlodipine.  Refills sent today.  Labs ordered today.  Return to clinic in 6 months for reevaluation.  Call sooner if concerns arise.

## 2022-03-09 NOTE — Assessment & Plan Note (Signed)
Recommended eating smaller high protein, low fat meals more frequently and exercising 30 mins a day 5 times a week with a goal of 10-15lb weight loss in the next 3 months. Patient voiced their understanding and motivation to adhere to these recommendations.  

## 2022-03-09 NOTE — Assessment & Plan Note (Signed)
Chronic.  Controlled.  Continue with current medication regimen of Atorvastatin daily.  Refills sent today.  Labs ordered today.  Return to clinic in 6 months for reevaluation.  Call sooner if concerns arise.    

## 2022-03-10 LAB — COMPREHENSIVE METABOLIC PANEL
ALT: 21 IU/L (ref 0–44)
AST: 16 IU/L (ref 0–40)
Albumin/Globulin Ratio: 1.9 (ref 1.2–2.2)
Albumin: 4.2 g/dL (ref 3.9–4.9)
Alkaline Phosphatase: 85 IU/L (ref 44–121)
BUN/Creatinine Ratio: 15 (ref 10–24)
BUN: 16 mg/dL (ref 8–27)
Bilirubin Total: 0.4 mg/dL (ref 0.0–1.2)
CO2: 23 mmol/L (ref 20–29)
Calcium: 9.2 mg/dL (ref 8.6–10.2)
Chloride: 105 mmol/L (ref 96–106)
Creatinine, Ser: 1.08 mg/dL (ref 0.76–1.27)
Globulin, Total: 2.2 g/dL (ref 1.5–4.5)
Glucose: 84 mg/dL (ref 70–99)
Potassium: 4.4 mmol/L (ref 3.5–5.2)
Sodium: 141 mmol/L (ref 134–144)
Total Protein: 6.4 g/dL (ref 6.0–8.5)
eGFR: 78 mL/min/{1.73_m2} (ref >59)

## 2022-03-10 LAB — HEMOGLOBIN A1C
Est. average glucose Bld gHb Est-mCnc: 128 mg/dL
Hgb A1c MFr Bld: 6.1 % — ABNORMAL HIGH (ref 4.8–5.6)

## 2022-03-10 LAB — LIPID PANEL
Chol/HDL Ratio: 4.6 ratio (ref 0.0–5.0)
Cholesterol, Total: 142 mg/dL (ref 100–199)
HDL: 31 mg/dL — ABNORMAL LOW (ref >39)
LDL Chol Calc (NIH): 85 mg/dL (ref 0–99)
Triglycerides: 149 mg/dL (ref 0–149)
VLDL Cholesterol Cal: 26 mg/dL (ref 5–40)

## 2022-03-10 NOTE — Progress Notes (Signed)
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  Your lab work looks good.  Your A1c remains well controlled at 6.1.  No concerns at this time. Continue with your current medication regimen.  Follow up as discussed.  Please let me know if you have any questions.

## 2022-04-10 ENCOUNTER — Ambulatory Visit: Payer: Self-pay | Admitting: *Deleted

## 2022-04-10 NOTE — Telephone Encounter (Signed)
Summary: Pt requesting call back regarding Rx for an ointment or cream for yeast infection   Pt requests Rx for an ointment or cream that he was prescribed previously. Pt does not have the name of the Rx and says it could have been prescribed by another provider. Pt stated the Rx was prescribed due to a yeast infection. Pt requests call back. Cb# [phone]

## 2022-04-10 NOTE — Telephone Encounter (Signed)
   Chief Complaint: medication request Symptoms: patient is requesting RF of yeast cream- itching flaking skin Frequency:   Pertinent Negatives:   Disposition: [] ED /[] Urgent Care (no appt availability in office) / [] Appointment(In office/virtual)/ []  [place] [facility]/ [] Home Care/ [] Refused Recommended Disposition /[] [place] [facility]/ [x]  Follow-up with PCP Additional Notes: Patient states he knows he has yeast and requesting medication-  wants it sent [facility]  Reason for Disposition  [1] Prescription refill request for NON-ESSENTIAL medicine (i.e., no harm to patient if med not taken) AND [2] triager unable to refill per department policy  Answer Assessment - Initial Assessment Questions 1. DRUG NAME: "What medicine do you need to have refilled?"     nystatin-triamcinolone (MYCOLOG II) cream 2. REFILLS REMAINING: "How many refills are remaining?" (Note: The label on the medicine or pill bottle will show how many refills are remaining. If there are no refills remaining, then a renewal may be needed.)     none 3. EXPIRATION DATE: "What is the expiration date?" (Note: The label states when the prescription will expire, and thus can no longer be refilled.)      4. PRESCRIBING HCP: "Who prescribed it?" Reason: If prescribed by specialist, call should be referred to that group.     Outside provider- surgeon  5. SYMPTOMS: "Do you have any symptoms?"     Itching, skin irritation- patient states ha shas tired OTC- not helping- is requesting RF of yeast Rx  Protocols used: Medication Refill and Renewal Call-A-AH

## 2022-04-12 ENCOUNTER — Encounter: Payer: Self-pay | Admitting: Nurse Practitioner

## 2022-04-12 ENCOUNTER — Ambulatory Visit: Payer: BC Managed Care – PPO | Admitting: Nurse Practitioner

## 2022-04-12 VITALS — BP 123/81 | HR 75 | Temp 98.6°F | Wt 211.9 lb

## 2022-04-12 DIAGNOSIS — L853 Xerosis cutis: Secondary | ICD-10-CM | POA: Diagnosis not present

## 2022-04-12 MED ORDER — NYSTATIN-TRIAMCINOLONE 100000-0.1 UNIT/GM-% EX OINT: 1.0000 | TOPICAL_OINTMENT | Freq: Two times a day (BID) | CUTANEOUS | 1 refills | Status: DC

## 2022-04-12 NOTE — Progress Notes (Unsigned)
   BP 123/81   Pulse 75   Temp 98.6 F (37 C) (Oral)   Wt 211 lb 14.4 oz (96.1 kg)   SpO2 98%   BMI 31.66 kg/m    Subjective:    Patient ID: [place], male    DOB: [birth year], 61 y.o.   MRN: [id number]  HPI: [place]an Vahle Guard is a 61 y.o. male  No chief complaint on file.  RASH Duration:  weeks Location: groin  Itching: no Burning: no Redness: no Oozing: no Scaling: no Blisters: no Painful: no Fevers: no Change in detergents/soaps/personal care products: no Recent illness: no Recent travel:no History of same: yes Context: stable Alleviating factors:  nystatin Treatments attempted:lotion/moisturizer Shortness of breath: no  Throat/tongue swelling: no Myalgias/arthralgias: no  Relevant past medical, surgical, family and social history reviewed and updated as indicated. Interim medical history since our last visit reviewed. Allergies and medications reviewed and updated.  Review of Systems  Per HPI unless specifically indicated above     Objective:    BP 123/81   Pulse 75   Temp 98.6 F (37 C) (Oral)   Wt 211 lb 14.4 oz (96.1 kg)   SpO2 98%   BMI 31.66 kg/m   Wt Readings from Last 3 Encounters:  04/12/22 211 lb 14.4 oz (96.1 kg)  03/09/22 216 lb 11.2 oz (98.3 kg)  09/06/21 213 lb 9.6 oz (96.9 kg)    Physical Exam  Results for orders placed or performed in visit on 03/09/22  Comp Met (CMET)  Result Value Ref Range   Glucose 84 70 - 99 mg/dL   BUN 16 8 - 27 mg/dL   Creatinine, Ser 1.08 0.76 - 1.27 mg/dL   eGFR 78 >59 mL/min/1.73   BUN/Creatinine Ratio 15 10 - 24   Sodium 141 134 - 144 mmol/L   Potassium 4.4 3.5 - 5.2 mmol/L   Chloride 105 96 - 106 mmol/L   CO2 23 20 - 29 mmol/L   Calcium 9.2 8.6 - 10.2 mg/dL   Total Protein 6.4 6.0 - 8.5 g/dL   Albumin 4.2 3.9 - 4.9 g/dL   Globulin, Total 2.2 1.5 - 4.5 g/dL   Albumin/Globulin Ratio 1.9 1.2 - 2.2   Bilirubin Total 0.4 0.0 - 1.2 mg/dL   Alkaline Phosphatase 85 44 - 121 IU/L   AST 16 0  - 40 IU/L   ALT 21 0 - 44 IU/L  Lipid Profile  Result Value Ref Range   Cholesterol, Total 142 100 - 199 mg/dL   Triglycerides 149 0 - 149 mg/dL   HDL 31 (L) >39 mg/dL   VLDL Cholesterol Cal 26 5 - 40 mg/dL   LDL Chol Calc (NIH) 85 0 - 99 mg/dL   Chol/HDL Ratio 4.6 0.0 - 5.0 ratio  HgB A1c  Result Value Ref Range   Hgb A1c MFr Bld 6.1 (H) 4.8 - 5.6 %   Est. average glucose Bld gHb Est-mCnc 128 mg/dL      Assessment & Plan:   Problem List Items Addressed This Visit   None    Follow up plan: No follow-ups on file.

## 2022-09-04 ENCOUNTER — Other Ambulatory Visit: Payer: Self-pay | Admitting: Nurse Practitioner

## 2022-09-04 NOTE — Telephone Encounter (Signed)
Requested by interface surescripts. Last labs 03/09/22. Future visit in 3 days.  Requested Prescriptions  Pending Prescriptions Disp Refills   atorvastatin (LIPITOR) 10 MG tablet [Pharmacy Med Name: ATORVASTATIN 10 MG TABLET] 90 tablet 1    Sig: TAKE 1 TABLET BY MOUTH EVERY DAY     Cardiovascular:  Antilipid - Statins Failed - 09/04/2022  2:26 AM      Failed - Lipid Panel in normal range within the last 12 months    Cholesterol, Total  Date Value Ref Range Status  03/09/2022 142 100 - 199 mg/dL Final   LDL Chol Calc (NIH)  Date Value Ref Range Status  03/09/2022 85 0 - 99 mg/dL Final   HDL  Date Value Ref Range Status  03/09/2022 31 (L) >39 mg/dL Final   Triglycerides  Date Value Ref Range Status  03/09/2022 149 0 - 149 mg/dL Final         Passed - Patient is not pregnant      Passed - Valid encounter within last 12 months    Recent Outpatient Visits           4 months ago Dry skin dermatitis   [place] [facility] Larae Grooms, NP   5 months ago Essential hypertension   [place] [facility] Larae Grooms, NP   12 months ago Annual physical exam   [place] [facility] Larae Grooms, NP   1 year ago 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

## 2022-09-05 ENCOUNTER — Encounter: Payer: BC Managed Care – PPO | Admitting: Nurse Practitioner

## 2022-09-07 ENCOUNTER — Encounter: Payer: BC Managed Care – PPO | Admitting: Nurse Practitioner

## 2022-09-08 ENCOUNTER — Encounter: Payer: Self-pay | Admitting: Nurse Practitioner

## 2022-09-08 ENCOUNTER — Ambulatory Visit (INDEPENDENT_AMBULATORY_CARE_PROVIDER_SITE_OTHER): Payer: BC Managed Care – PPO | Admitting: Nurse Practitioner

## 2022-09-08 VITALS — BP 126/75 | HR 70 | Temp 98.4°F | Ht 69.0 in | Wt 215.0 lb

## 2022-09-08 DIAGNOSIS — E7800 Pure hypercholesterolemia, unspecified: Secondary | ICD-10-CM | POA: Diagnosis not present

## 2022-09-08 DIAGNOSIS — Z6830 Body mass index (BMI) 30.0-30.9, adult: Secondary | ICD-10-CM

## 2022-09-08 DIAGNOSIS — Z0000 Encounter for general adult medical examination without abnormal findings: Secondary | ICD-10-CM | POA: Diagnosis not present

## 2022-09-08 DIAGNOSIS — E6609 Other obesity due to excess calories: Secondary | ICD-10-CM | POA: Diagnosis not present

## 2022-09-08 DIAGNOSIS — I10 Essential (primary) hypertension: Secondary | ICD-10-CM | POA: Diagnosis not present

## 2022-09-08 DIAGNOSIS — Z136 Encounter for screening for cardiovascular disorders: Secondary | ICD-10-CM | POA: Diagnosis not present

## 2022-09-08 DIAGNOSIS — Z1211 Encounter for screening for malignant neoplasm of colon: Secondary | ICD-10-CM

## 2022-09-08 LAB — MICROSCOPIC EXAMINATION
Bacteria, UA: NONE SEEN
WBC, UA: NONE SEEN /hpf (ref 0–5)

## 2022-09-08 LAB — URINALYSIS, ROUTINE W REFLEX MICROSCOPIC
Bilirubin, UA: NEGATIVE
Glucose, UA: NEGATIVE
Ketones, UA: NEGATIVE
Leukocytes,UA: NEGATIVE
Nitrite, UA: NEGATIVE
Protein,UA: NEGATIVE
Specific Gravity, UA: 1.025 (ref 1.005–1.030)
Urobilinogen, Ur: 1 mg/dL (ref 0.2–1.0)
pH, UA: 6 (ref 5.0–7.5)

## 2022-09-08 MED ORDER — SILDENAFIL CITRATE 100 MG PO TABS: 50.0000 mg | ORAL_TABLET | Freq: Every day | ORAL | 3 refills | Status: DC | PRN

## 2022-09-08 MED ORDER — AMLODIPINE BESYLATE 10 MG PO TABS
10.0000 mg | ORAL_TABLET | Freq: Every day | ORAL | 1 refills | Status: DC
Start: 2022-09-08 — End: 2023-03-13

## 2022-09-08 MED ORDER — LOSARTAN POTASSIUM 100 MG PO TABS: 100.0000 mg | ORAL_TABLET | Freq: Every day | ORAL | 1 refills | Status: DC

## 2022-09-08 MED ORDER — ATORVASTATIN CALCIUM 10 MG PO TABS: 10.0000 mg | ORAL_TABLET | Freq: Every day | ORAL | 1 refills | Status: DC

## 2022-09-08 NOTE — Assessment & Plan Note (Signed)
Chronic.  Controlled.  Continue with current medication regimen of Atorvastatin 10mg daily.  Refills sent today.  Labs ordered today.  Return to clinic in 6 months for reevaluation.  Call sooner if concerns arise.  ? ?

## 2022-09-08 NOTE — Assessment & Plan Note (Signed)
Recommended eating smaller high protein, low fat meals more frequently and exercising 30 mins a day 5 times a week with a goal of 10-15lb weight loss in the next 3 months.  

## 2022-09-08 NOTE — Assessment & Plan Note (Signed)
Chronic.  Controlled.  Continue with current medication regimen of Losartan and Amlodipine.  Refills sent today.  Labs ordered today.  Return to clinic in 6 months for reevaluation.  Call sooner if concerns arise.   

## 2022-09-08 NOTE — Progress Notes (Signed)
BP 126/75   Pulse 70   Temp 98.4 F (36.9 C)   Ht 5\' 9"  (1.753 m)   Wt 215 lb (97.5 kg)   SpO2 94%   BMI 31.75 kg/m    Subjective:    Patient ID: Jeffrey Hunter, male    DOB: [birth year], 62 y.o.   MRN: [id number]  HPI: Jeffrey Hunter is a 62 y.o. male presenting on 09/08/2022 for comprehensive medical examination. Current medical complaints include:none  He currently lives with: Interim Problems from his last visit: no  HYPERTENSION / HYPERLIPIDEMIA Satisfied with current treatment? no Duration of hypertension: years BP monitoring frequency: not checking BP range:  BP medication side effects: no Past BP meds: amlodipine and Losartan 100mg  Duration of hyperlipidemia: years Cholesterol medication side effects: no Cholesterol supplements: none Past cholesterol medications: atorvastain (lipitor) Medication compliance: excellent compliance Aspirin: no Recent stressors: no Recurrent headaches: no Visual changes: no Palpitations: no Dyspnea: no Chest pain: no Lower extremity edema: no Dizzy/lightheaded: no  Patient would like to use viagra.     Depression Screen done today and results listed below:     09/08/2022    8:11 AM 03/09/2022    8:12 AM 09/06/2021    8:15 AM 04/05/2021    8:03 AM 08/10/2020    8:40 AM  Depression screen PHQ 2/9  Decreased Interest 0 0 0 0 0  Down, Depressed, Hopeless 0 0 0 0 0  PHQ - 2 Score 0 0 0 0 0  Altered sleeping 0 0 0 0   Tired, decreased energy 0 0 0 0   Change in appetite 0 0 0 0   Feeling bad or failure about yourself  0 0 0 0   Trouble concentrating 0 0 0 0   Moving slowly or fidgety/restless 0 0 0 0   Suicidal thoughts 0 0 0 0   PHQ-9 Score 0 0 0 0   Difficult doing work/chores  Not difficult at all Not difficult at all Not difficult at all     The patient does not have a history of falls. I did complete a risk assessment for falls. A plan of care for falls was documented.   Past Medical History:  Past Medical  History:  Diagnosis Date   Chronic lower back pain    Hyperlipidemia    Hypertension     Surgical History:  Past Surgical History:  Procedure Laterality Date   vascectomy      Medications:  Current Outpatient Medications on File Prior to Visit  Medication Sig   ASPIRIN 81 PO Take by mouth as needed.   nystatin-triamcinolone ointment (MYCOLOG) Apply 1 Application topically 2 (two) times daily.   No current facility-administered medications on file prior to visit.    Allergies:  No Known Allergies  Social History:  Social History   Socioeconomic History   Marital status: Married    Spouse name: Not on file   Number of children: Not on file   Years of education: Not on file   Highest education level: Not on file  Occupational History   Not on file  Tobacco Use   Smoking status: Every Day    Packs/day: 0.50    Years: 36.00    Additional pack years: 0.00    Total pack years: 18.00    Types: Cigarettes   Smokeless tobacco: Never  Vaping Use   Vaping Use: Never used  Substance and Sexual Activity   Alcohol use: Yes  Comment: ocassionally   Drug use: Never   Sexual activity: Yes  Other Topics Concern   Not on file  Social History Narrative   Not on file   Social Determinants of Health   Financial Resource Strain: Not on file  Food Insecurity: Not on file  Transportation Needs: Not on file  Physical Activity: Not on file  Stress: Not on file  Social Connections: Not on file  Intimate Partner Violence: Not on file   Social History   Tobacco Use  Smoking Status Every Day   Packs/day: 0.50   Years: 36.00   Additional pack years: 0.00   Total pack years: 18.00   Types: Cigarettes  Smokeless Tobacco Never   Social History   Substance and Sexual Activity  Alcohol Use Yes   Comment: ocassionally    Family History:  Family History  Problem Relation Age of Onset   Diabetes Sister    Heart attack Paternal Grandfather     Past medical history,  surgical history, medications, allergies, family history and social history reviewed with patient today and changes made to appropriate areas of the chart.   Review of Systems  Eyes:  Negative for blurred vision and double vision.  Respiratory:  Negative for shortness of breath.   Cardiovascular:  Negative for chest pain, palpitations and leg swelling.  Neurological:  Negative for dizziness and headaches.   All other ROS negative except what is listed above and in the HPI.      Objective:    BP 126/75   Pulse 70   Temp 98.4 F (36.9 C)   Ht 5\' 9"  (1.753 m)   Wt 215 lb (97.5 kg)   SpO2 94%   BMI 31.75 kg/m   Wt Readings from Last 3 Encounters:  09/08/22 215 lb (97.5 kg)  04/12/22 211 lb 14.4 oz (96.1 kg)  03/09/22 216 lb 11.2 oz (98.3 kg)    Physical Exam Vitals and nursing note reviewed.  Constitutional:      General: He is not in acute distress.    Appearance: Normal appearance. He is obese. He is not ill-appearing, toxic-appearing or diaphoretic.  HENT:     Head: Normocephalic.     Right Ear: Tympanic membrane, ear canal and external ear normal.     Left Ear: Tympanic membrane, ear canal and external ear normal.     Nose: Nose normal. No congestion or rhinorrhea.     Mouth/Throat:     Mouth: Mucous membranes are moist.  Eyes:     General:        Right eye: No discharge.        Left eye: No discharge.     Extraocular Movements: Extraocular movements intact.     Conjunctiva/sclera: Conjunctivae normal.     Pupils: Pupils are equal, round, and reactive to light.  Cardiovascular:     Rate and Rhythm: Normal rate and regular rhythm.     Heart sounds: No murmur heard. Pulmonary:     Effort: Pulmonary effort is normal. No respiratory distress.     Breath sounds: Normal breath sounds. No wheezing, rhonchi or rales.  Abdominal:     General: Abdomen is flat. Bowel sounds are normal. There is no distension.     Palpations: Abdomen is soft.     Tenderness: There is no  abdominal tenderness. There is no guarding.  Musculoskeletal:     Cervical back: Normal range of motion and neck supple.  Skin:    General: Skin is  warm and dry.     Capillary Refill: Capillary refill takes less than 2 seconds.  Neurological:     General: No focal deficit present.     Mental Status: He is alert and oriented to person, place, and time.     Cranial Nerves: No cranial nerve deficit.     Motor: No weakness.     Deep Tendon Reflexes: Reflexes normal.  Psychiatric:        Mood and Affect: Mood normal.        Behavior: Behavior normal.        Thought Content: Thought content normal.        Judgment: Judgment normal.     Results for orders placed or performed in visit on 03/09/22  Comp Met (CMET)  Result Value Ref Range   Glucose 84 70 - 99 mg/dL   BUN 16 8 - 27 mg/dL   Creatinine, Ser 4.09 0.76 - 1.27 mg/dL   eGFR 78 >81 XB/JYN/8.29   BUN/Creatinine Ratio 15 10 - 24   Sodium 141 134 - 144 mmol/L   Potassium 4.4 3.5 - 5.2 mmol/L   Chloride 105 96 - 106 mmol/L   CO2 23 20 - 29 mmol/L   Calcium 9.2 8.6 - 10.2 mg/dL   Total Protein 6.4 6.0 - 8.5 g/dL   Albumin 4.2 3.9 - 4.9 g/dL   Globulin, Total 2.2 1.5 - 4.5 g/dL   Albumin/Globulin Ratio 1.9 1.2 - 2.2   Bilirubin Total 0.4 0.0 - 1.2 mg/dL   Alkaline Phosphatase 85 44 - 121 IU/L   AST 16 0 - 40 IU/L   ALT 21 0 - 44 IU/L  Lipid Profile  Result Value Ref Range   Cholesterol, Total 142 100 - 199 mg/dL   Triglycerides 562 0 - 149 mg/dL   HDL 31 (L) >13 mg/dL   VLDL Cholesterol Cal 26 5 - 40 mg/dL   LDL Chol Calc (NIH) 85 0 - 99 mg/dL   Chol/HDL Ratio 4.6 0.0 - 5.0 ratio  HgB A1c  Result Value Ref Range   Hgb A1c MFr Bld 6.1 (H) 4.8 - 5.6 %   Est. average glucose Bld gHb Est-mCnc 128 mg/dL      Assessment & Plan:   Problem List Items Addressed This Visit       Cardiovascular and Mediastinum   Essential hypertension    Chronic.  Controlled.  Continue with current medication regimen of Losartan and  Amlodipine.  Refills sent today.  Labs ordered today.  Return to clinic in 6 months for reevaluation.  Call sooner if concerns arise.        Relevant Medications   sildenafil (VIAGRA) 100 MG tablet   amLODipine (NORVASC) 10 MG tablet   atorvastatin (LIPITOR) 10 MG tablet   losartan (COZAAR) 100 MG tablet   Other Relevant Orders   Comprehensive metabolic panel     Other   Hyperlipidemia    Chronic.  Controlled.  Continue with current medication regimen of Atorvastatin 10mg  daily.  Refills sent today.  Labs ordered today.  Return to clinic in 6 months for reevaluation.  Call sooner if concerns arise.       Relevant Medications   sildenafil (VIAGRA) 100 MG tablet   amLODipine (NORVASC) 10 MG tablet   atorvastatin (LIPITOR) 10 MG tablet   losartan (COZAAR) 100 MG tablet   Other Relevant Orders   Lipid panel   Obesity    Recommended eating smaller high protein, low fat meals more  frequently and exercising 30 mins a day 5 times a week with a goal of 10-15lb weight loss in the next 3 months.       Other Visit Diagnoses     Annual physical exam    -  Primary   Health maintenance reviewed during visit today.  Labs ordered.  Declined shingles shot.  Cologuard ordered.   Relevant Orders   TSH   PSA   Lipid panel   CBC with Differential/Platelet   Comprehensive metabolic panel   Urinalysis, Routine w reflex microscopic   Screening for colon cancer       Relevant Orders   Cologuard        Discussed aspirin prophylaxis for myocardial infarction prevention and decision was made to continue ASA  LABORATORY TESTING:  Health maintenance labs ordered today as discussed above.   The natural history of prostate cancer and ongoing controversy regarding screening and potential treatment outcomes of prostate cancer has been discussed with the patient. The meaning of a false positive PSA and a false negative PSA has been discussed. He indicates understanding of the limitations of this  screening test and wishes to proceed with screening PSA testing.   IMMUNIZATIONS:   - Tdap: Tetanus vaccination status reviewed: last tetanus booster within 10 years. - Influenza: Postponed to flu season - Pneumovax: Up to date - Prevnar: Not applicable - COVID: Up to date - HPV: Not applicable - Shingrix vaccine:  Discussed at visit today - declined  SCREENING: - Colonoscopy: ordered today Discussed with patient purpose of the colonoscopy is to detect colon cancer at curable precancerous or early stages   - AAA Screening: Not applicable  -Hearing Test: Not applicable  -Spirometry: Not applicable   PATIENT COUNSELING:    Sexuality: Discussed sexually transmitted diseases, partner selection, use of condoms, avoidance of unintended pregnancy  and contraceptive alternatives.   Advised to avoid cigarette smoking.  I discussed with the patient that most people either abstain from alcohol or drink within safe limits (<=14/week and <=4 drinks/occasion for males, <=7/weeks and <= 3 drinks/occasion for females) and that the risk for alcohol disorders and other health effects rises proportionally with the number of drinks per week and how often a drinker exceeds daily limits.  Discussed cessation/primary prevention of drug use and availability of treatment for abuse.   Diet: Encouraged to adjust caloric intake to maintain  or achieve ideal body weight, to reduce intake of dietary saturated fat and total fat, to limit sodium intake by avoiding high sodium foods and not adding table salt, and to maintain adequate dietary potassium and calcium preferably from fresh fruits, vegetables, and low-fat dairy products.    stressed the importance of regular exercise  Injury prevention: Discussed safety belts, safety helmets, smoke detector, smoking near bedding or upholstery.   Dental health: Discussed importance of regular tooth brushing, flossing, and dental visits.   Follow up plan: NEXT  PREVENTATIVE PHYSICAL DUE IN 1 YEAR. No follow-ups on file.

## 2022-09-09 LAB — COMPREHENSIVE METABOLIC PANEL
ALT: 25 IU/L (ref 0–44)
AST: 14 IU/L (ref 0–40)
Albumin/Globulin Ratio: 2 (ref 1.2–2.2)
Albumin: 4.4 g/dL (ref 3.9–4.9)
Alkaline Phosphatase: 109 IU/L (ref 44–121)
BUN/Creatinine Ratio: 15 (ref 10–24)
BUN: 17 mg/dL (ref 8–27)
Bilirubin Total: 0.4 mg/dL (ref 0.0–1.2)
CO2: 22 mmol/L (ref 20–29)
Calcium: 9.7 mg/dL (ref 8.6–10.2)
Chloride: 107 mmol/L — ABNORMAL HIGH (ref 96–106)
Creatinine, Ser: 1.12 mg/dL (ref 0.76–1.27)
Globulin, Total: 2.2 g/dL (ref 1.5–4.5)
Glucose: 98 mg/dL (ref 70–99)
Potassium: 4.6 mmol/L (ref 3.5–5.2)
Sodium: 143 mmol/L (ref 134–144)
Total Protein: 6.6 g/dL (ref 6.0–8.5)
eGFR: 74 mL/min/{1.73_m2} (ref >59)

## 2022-09-09 LAB — CBC WITH DIFFERENTIAL/PLATELET
Basophils Absolute: 0 10*3/uL (ref 0.0–0.2)
Basos: 1 %
EOS (ABSOLUTE): 0.3 10*3/uL (ref 0.0–0.4)
Eos: 4 %
Hematocrit: 51.2 % — ABNORMAL HIGH (ref 37.5–51.0)
Hemoglobin: 17.2 g/dL (ref 13.0–17.7)
Immature Grans (Abs): 0 10*3/uL (ref 0.0–0.1)
Immature Granulocytes: 0 %
Lymphocytes Absolute: 1 10*3/uL (ref 0.7–3.1)
Lymphs: 16 %
MCH: 30 pg (ref 26.6–33.0)
MCHC: 33.6 g/dL (ref 31.5–35.7)
MCV: 89 fL (ref 79–97)
Monocytes Absolute: 0.5 10*3/uL (ref 0.1–0.9)
Monocytes: 7 %
Neutrophils Absolute: 4.8 10*3/uL (ref 1.4–7.0)
Neutrophils: 72 %
Platelets: 185 10*3/uL (ref 150–450)
RBC: 5.73 x10E6/uL (ref 4.14–5.80)
RDW: 14.2 % (ref 11.6–15.4)
WBC: 6.6 10*3/uL (ref 3.4–10.8)

## 2022-09-09 LAB — LIPID PANEL
Chol/HDL Ratio: 4.9 ratio (ref 0.0–5.0)
Cholesterol, Total: 156 mg/dL (ref 100–199)
HDL: 32 mg/dL — ABNORMAL LOW (ref >39)
LDL Chol Calc (NIH): 96 mg/dL (ref 0–99)
Triglycerides: 159 mg/dL — ABNORMAL HIGH (ref 0–149)
VLDL Cholesterol Cal: 28 mg/dL (ref 5–40)

## 2022-09-09 LAB — TSH: TSH: 0.734 u[IU]/mL (ref 0.450–4.500)

## 2022-09-09 LAB — PSA: Prostate Specific Ag, Serum: 0.3 ng/mL (ref 0.0–4.0)

## 2022-09-11 NOTE — Progress Notes (Signed)
HI Jeffrey Hunter. It was nice to see you last week.  Your lab work looks good.  No concerns at this time. Continue with your current medication regimen.  Follow up as discussed.  Please let me know if you have any questions.

## 2022-09-22 ENCOUNTER — Telehealth: Payer: Self-pay | Admitting: Nurse Practitioner

## 2022-09-22 NOTE — Telephone Encounter (Signed)
Copied from CRM [id number]. Topic: General - Other >> Sep 22, 2022 10:41 AM Carrielelia G wrote: Patient is Questioning why he is getting only 4 pills of sildenafil (VIAGRA) 100 MG tablet, when he is suppose to get 10 Please advise   [id number] [place], [place] - [address] [place] [address] Phone: [phone] Fax: [phone]

## 2022-09-22 NOTE — Telephone Encounter (Signed)
[facility]. Patient is only getting 4 tablets a month due to his insurance only covering that amount. Pharmacist states that the patient would have to pay out of pocket for the other 6 tablets of the RX if the patient wanted the full 10 tablets.    Called and notified patient of the above. Patient verbalized understanding.

## 2022-09-24 DIAGNOSIS — Z1211 Encounter for screening for malignant neoplasm of colon: Secondary | ICD-10-CM | POA: Diagnosis not present

## 2022-09-29 LAB — COLOGUARD: COLOGUARD: NEGATIVE

## 2022-10-02 NOTE — Progress Notes (Signed)
Good Morning.  Your Cologuard was negative.  We will repeat it in 3 years.

## 2023-01-28 ENCOUNTER — Other Ambulatory Visit: Payer: Self-pay | Admitting: Nurse Practitioner

## 2023-01-28 DIAGNOSIS — J302 Other seasonal allergic rhinitis: Secondary | ICD-10-CM

## 2023-01-29 NOTE — Telephone Encounter (Signed)
Unable to refill per protocol, Rx expired.  09/08/22.  Requested Prescriptions  Pending Prescriptions Disp Refills   cetirizine (ZYRTEC) 10 MG tablet [Pharmacy Med Name: CETIRIZINE HCL 10 MG TABLET] 90 tablet 3    Sig: TAKE 1 TABLET BY MOUTH EVERY DAY     Ear, Nose, and Throat:  Antihistamines 2 Passed - 01/28/2023  4:46 AM      Passed - Cr in normal range and within 360 days    Creatinine, Ser  Date Value Ref Range Status  09/08/2022 1.12 0.76 - 1.27 mg/dL Final         Passed - Valid encounter within last 12 months    Recent Outpatient Visits           4 months ago Annual physical exam   [place] [facility] Larae Grooms, NP   9 months ago Dry skin dermatitis   [place] [facility] Larae G[facility], [facility]   10 months ago Essential hypertension   [place] [facility] Larae Grooms, NP   1 year ago Annual physical exam   [place] Larae Grooms, NP   1 year ago 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

## 2023-02-02 ENCOUNTER — Other Ambulatory Visit: Payer: Self-pay | Admitting: Nurse Practitioner

## 2023-02-02 DIAGNOSIS — J302 Other seasonal allergic rhinitis: Secondary | ICD-10-CM

## 2023-02-02 NOTE — Telephone Encounter (Signed)
  The original prescription was discontinued on 09/08/2022 by Andre Lefort, CMA   Requested Prescriptions  Pending Prescriptions Disp Refills   nystatin-triamcinolone ointment (MYCOLOG) 60 g 1    Sig: Apply 1 Application topically 2 (two) times daily.     Off-Protocol Failed - 02/02/2023  9:37 AM      Failed - Medication not assigned to a protocol, review manually.      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     Ear, Nose, and Throat:  Antihistamines 2 Passed - 02/02/2023  9:37 AM      Passed - Cr in normal range and within 360 days    Creatinine, Ser  Date Value Ref Range Status  09/08/2022 1.12 0.76 - 1.27 mg/dL Final         Passed - Valid encounter within last 12 months    Recent Outpatient Visits           4 months ago Annual physical exam   [place] [facility] Larae Grooms, NP   9 months ago Dry skin dermatitis   [place] [facility] Larae Grooms, NP   11 months ago Essential hypertension   [place] [facility] Larae Grooms, NP   1 year ago Annual physical exam   [place] [facility] Larae Grooms,  NP   1 year ago 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

## 2023-02-02 NOTE — Telephone Encounter (Signed)
Requested medication (s) are due for refill today: yes  Requested medication (s) are on the active medication list: yes  Last refill:  04/12/22 #60g/1  Future visit scheduled: yes  Notes to clinic:  Unable to refill per protocol, medication not assigned to the refill protocol.    Requested Prescriptions  Pending Prescriptions Disp Refills   nystatin-triamcinolone ointment (MYCOLOG) 60 g 1    Sig: Apply 1 Application topically 2 (two) times daily.     Off-Protocol Failed - 02/02/2023  9:37 AM      Failed - Medication not assigned to a protocol, review manually.      Passed - Valid encounter within last 12 months    Recent Outpatient Visits           4 months ago Annual physical exam   [place] 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     Ear, Nose, and Throat:  Antihistamines 2 Passed - 02/02/2023  9:37 AM      Passed - Cr in normal range and within 360 days    Creatinine, Ser  Date Value Ref Range Status  09/08/2022 1.12 0.76 - 1.27 mg/dL Final         Passed - Valid encounter within last 12 months    Recent Outpatient Visits           4 months ago Annual physical exam   [place] [facility] Larae Grooms, NP   9 months ago Dry skin dermatitis   [facility] G[facility], NP   11 months ago Essential hypertension   [place] [facility] Larae Grooms, NP   1 year ago Annual physical exam   [facility] Larae Grooms, NP   1 year ago 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

## 2023-02-02 NOTE — Telephone Encounter (Signed)
Medication Refill - Medication: cetirizine (ZYRTEC) 10 MG tablet   Plus he wants a refill of the ointment that he received for a yeast infection, he does not know the name of what he needs.   Has the patient contacted their pharmacy? Yes.   (Agent: If no, request that the patient contact the pharmacy for the refill. If patient does not wish to contact the pharmacy document the reason why and proceed with request.) (Agent: If yes, when and what did the pharmacy advise?)  Preferred Pharmacy (with phone number or street name):  [id number] [place] [place] - [address]  [address] [place] [address]  Phone: [phone] Fax: [phone]   Has the patient been seen for an appointment in the last year OR does the patient have an upcoming appointment? Yes.    Agent: Please be advised that RX refills may take up to 3 business days. We ask that you follow-up with your pharmacy.

## 2023-02-04 MED ORDER — NYSTATIN-TRIAMCINOLONE 100000-0.1 UNIT/GM-% EX OINT: 1.0000 | TOPICAL_OINTMENT | Freq: Two times a day (BID) | CUTANEOUS | 1 refills | Status: DC

## 2023-02-05 ENCOUNTER — Telehealth: Payer: Self-pay | Admitting: Nurse Practitioner

## 2023-02-05 NOTE — Telephone Encounter (Signed)
Zyrtec is over-the-counter

## 2023-02-05 NOTE — Telephone Encounter (Signed)
Patient called to see if provider would write another scrip for cetirizine (ZYRTEC) 10 MG tablet as he needs it for his allergies. Please f/u with patient.

## 2023-02-05 NOTE — Telephone Encounter (Signed)
Called and informed patient. Patient voiced understanding.

## 2023-03-12 NOTE — Progress Notes (Unsigned)
There were no vitals taken for this visit.   Subjective:    Patient ID: Jeffrey Hunter, male    DOB: Aug 04, 1960, 62 y.o.   MRN: [id number]  HPI: Jeffrey Hunter is a 62 y.o. male  No chief complaint on file.  HYPERTENSION / HYPERLIPIDEMIA Satisfied with current treatment? no Duration of hypertension: years BP monitoring frequency: not checking BP range:  BP medication side effects: no Past BP meds: amlodipine and Losartan 100mg  Duration of hyperlipidemia: years Cholesterol medication side effects: no Cholesterol supplements: none Past cholesterol medications: atorvastain (lipitor) Medication compliance: excellent compliance Aspirin: no Recent stressors: no Recurrent headaches: no Visual changes: no Palpitations: no Dyspnea: no Chest pain: no Lower extremity edema: no Dizzy/lightheaded: no Relevant past medical, surgical, family and social history reviewed and updated as indicated. Interim medical history since our last visit reviewed. Allergies and medications reviewed and updated.  Review of Systems  Per HPI unless specifically indicated above     Objective:    There were no vitals taken for this visit.  Wt Readings from Last 3 Encounters:  09/08/22 215 lb (97.5 kg)  04/12/22 211 lb 14.4 oz (96.1 kg)  03/09/22 216 lb 11.2 oz (98.3 kg)    Physical Exam  Results for orders placed or performed in visit on 09/08/22  Microscopic Examination   Urine  Result Value Ref Range   WBC, UA None seen 0 - 5 /hpf   RBC, Urine 0-2 0 - 2 /hpf   Epithelial Cells (non renal) 0-10 0 - 10 /hpf   Bacteria, UA None seen None seen/Few  TSH  Result Value Ref Range   TSH 0.734 0.450 - 4.500 uIU/mL  PSA  Result Value Ref Range   Prostate Specific Ag, Serum 0.3 0.0 - 4.0 ng/mL  Lipid panel  Result Value Ref Range   Cholesterol, Total 156 100 - 199 mg/dL   Triglycerides 644 (H) 0 - 149 mg/dL   HDL 32 (L) >03 mg/dL   VLDL Cholesterol Cal 28 5 - 40 mg/dL   LDL Chol Calc  (NIH) 96 0 - 99 mg/dL   Chol/HDL Ratio 4.9 0.0 - 5.0 ratio  CBC with Differential/Platelet  Result Value Ref Range   WBC 6.6 3.4 - 10.8 x10E3/uL   RBC 5.73 4.14 - 5.80 x10E6/uL   Hemoglobin 17.2 13.0 - 17.7 g/dL   Hematocrit 47.4 (H) 25.9 - 51.0 %   MCV 89 79 - 97 fL   MCH 30.0 26.6 - 33.0 pg   MCHC 33.6 31.5 - 35.7 g/dL   RDW 56.3 87.5 - 64.3 %   Platelets 185 150 - 450 x10E3/uL   Neutrophils 72 Not Estab. %   Lymphs 16 Not Estab. %   Monocytes 7 Not Estab. %   Eos 4 Not Estab. %   Basos 1 Not Estab. %   Neutrophils Absolute 4.8 1.4 - 7.0 x10E3/uL   Lymphocytes Absolute 1.0 0.7 - 3.1 x10E3/uL   Monocytes Absolute 0.5 0.1 - 0.9 x10E3/uL   EOS (ABSOLUTE) 0.3 0.0 - 0.4 x10E3/uL   Basophils Absolute 0.0 0.0 - 0.2 x10E3/uL   Immature Granulocytes 0 Not Estab. %   Immature Grans (Abs) 0.0 0.0 - 0.1 x10E3/uL  Comprehensive metabolic panel  Result Value Ref Range   Glucose 98 70 - 99 mg/dL   BUN 17 8 - 27 mg/dL   Creatinine, Ser 3.29 0.76 - 1.27 mg/dL   eGFR 74 >51 OA/CZY/6.06   BUN/Creatinine Ratio 15 10 - 24  Sodium 143 134 - 144 mmol/L   Potassium 4.6 3.5 - 5.2 mmol/L   Chloride 107 (H) 96 - 106 mmol/L   CO2 22 20 - 29 mmol/L   Calcium 9.7 8.6 - 10.2 mg/dL   Total Protein 6.6 6.0 - 8.5 g/dL   Albumin 4.4 3.9 - 4.9 g/dL   Globulin, Total 2.2 1.5 - 4.5 g/dL   Albumin/Globulin Ratio 2.0 1.2 - 2.2   Bilirubin Total 0.4 0.0 - 1.2 mg/dL   Alkaline Phosphatase 109 44 - 121 IU/L   AST 14 0 - 40 IU/L   ALT 25 0 - 44 IU/L  Urinalysis, Routine w reflex microscopic  Result Value Ref Range   Specific Gravity, UA 1.025 1.005 - 1.030   pH, UA 6.0 5.0 - 7.5   Color, UA Yellow Yellow   Appearance Ur Clear Clear   Leukocytes,UA Negative Negative   Protein,UA Negative Negative/Trace   Glucose, UA Negative Negative   Ketones, UA Negative Negative   RBC, UA Trace (A) Negative   Bilirubin, UA Negative Negative   Urobilinogen, Ur 1.0 0.2 - 1.0 mg/dL   Nitrite, UA Negative Negative    Microscopic Examination See below:   Cologuard  Result Value Ref Range   COLOGUARD Negative Negative      Assessment & Plan:   Problem List Items Addressed This Visit       Cardiovascular and Mediastinum   Essential hypertension - Primary     Other   Hyperlipidemia   Nicotine dependence, cigarettes, uncomplicated   Obesity     Follow up plan: No follow-ups on file.

## 2023-03-13 ENCOUNTER — Encounter: Payer: Self-pay | Admitting: Nurse Practitioner

## 2023-03-13 ENCOUNTER — Ambulatory Visit: Payer: BC Managed Care – PPO | Admitting: Nurse Practitioner

## 2023-03-13 DIAGNOSIS — E66811 Obesity, class 1: Secondary | ICD-10-CM | POA: Diagnosis not present

## 2023-03-13 DIAGNOSIS — I10 Essential (primary) hypertension: Secondary | ICD-10-CM

## 2023-03-13 DIAGNOSIS — E7800 Pure hypercholesterolemia, unspecified: Secondary | ICD-10-CM

## 2023-03-13 DIAGNOSIS — E6609 Other obesity due to excess calories: Secondary | ICD-10-CM

## 2023-03-13 DIAGNOSIS — Z6830 Body mass index (BMI) 30.0-30.9, adult: Secondary | ICD-10-CM

## 2023-03-13 DIAGNOSIS — F17210 Nicotine dependence, cigarettes, uncomplicated: Secondary | ICD-10-CM

## 2023-03-13 MED ORDER — LOSARTAN POTASSIUM 100 MG PO TABS: 100.0000 mg | ORAL_TABLET | Freq: Every day | ORAL | 1 refills | Status: DC

## 2023-03-13 MED ORDER — SILDENAFIL CITRATE 100 MG PO TABS: 50.0000 mg | ORAL_TABLET | Freq: Every day | ORAL | 3 refills | Status: DC | PRN

## 2023-03-13 MED ORDER — ATORVASTATIN CALCIUM 10 MG PO TABS: 10.0000 mg | ORAL_TABLET | Freq: Every day | ORAL | 1 refills | Status: DC

## 2023-03-13 MED ORDER — AMLODIPINE BESYLATE 10 MG PO TABS: 10.0000 mg | ORAL_TABLET | Freq: Every day | ORAL | 1 refills | Status: DC

## 2023-03-13 NOTE — Assessment & Plan Note (Signed)
Chronic.  Controlled.  Continue with current medication regimen of Losartan and Amlodipine.  Recommend checking blood pressures at home.  Bring log to next visit. Refills sent today.  Labs ordered today.  Return to clinic in 6 months for reevaluation.  Call sooner if concerns arise.

## 2023-03-13 NOTE — Assessment & Plan Note (Signed)
Recommended eating smaller high protein, low fat meals more frequently and exercising 30 mins a day 5 times a week with a goal of 10-15lb weight loss in the next 3 months.  

## 2023-03-13 NOTE — Assessment & Plan Note (Signed)
Chronic.  Controlled.  Continue with current medication regimen of Atorvastatin 10mg  daily.  Refills sent today.  Labs ordered today.  Return to clinic in 6 months for reevaluation.  Call sooner if concerns arise.  ? ?

## 2023-03-14 LAB — COMPREHENSIVE METABOLIC PANEL
ALT: 18 [IU]/L (ref 0–44)
AST: 18 [IU]/L (ref 0–40)
Albumin: 4.4 g/dL (ref 3.9–4.9)
Alkaline Phosphatase: 103 [IU]/L (ref 44–121)
BUN/Creatinine Ratio: 13 (ref 10–24)
BUN: 14 mg/dL (ref 8–27)
Bilirubin Total: 0.7 mg/dL (ref 0.0–1.2)
CO2: 24 mmol/L (ref 20–29)
Calcium: 9.2 mg/dL (ref 8.6–10.2)
Chloride: 104 mmol/L (ref 96–106)
Creatinine, Ser: 1.11 mg/dL (ref 0.76–1.27)
Globulin, Total: 2.3 g/dL (ref 1.5–4.5)
Glucose: 85 mg/dL (ref 70–99)
Potassium: 4.6 mmol/L (ref 3.5–5.2)
Sodium: 142 mmol/L (ref 134–144)
Total Protein: 6.7 g/dL (ref 6.0–8.5)
eGFR: 75 mL/min/{1.73_m2} (ref >59)

## 2023-03-14 LAB — LIPID PANEL
Chol/HDL Ratio: 4.5 ratio (ref 0.0–5.0)
Cholesterol, Total: 161 mg/dL (ref 100–199)
HDL: 36 mg/dL — ABNORMAL LOW (ref >39)
LDL Chol Calc (NIH): 96 mg/dL (ref 0–99)
Triglycerides: 165 mg/dL — ABNORMAL HIGH (ref 0–149)
VLDL Cholesterol Cal: 29 mg/dL (ref 5–40)

## 2023-06-29 ENCOUNTER — Other Ambulatory Visit: Payer: Self-pay | Admitting: Nurse Practitioner

## 2023-06-29 NOTE — Telephone Encounter (Signed)
 Copied from CRM [id number]. Topic: Clinical - Medication Refill >> Jun 29, 2023 12:06 PM Geroge Baseman wrote: Most Recent Primary Care Visit:  Provider: Larae Grooms  Department: [id number]  Visit Type: OFFICE VISIT  Date: 03/13/2023  Medication: nystatin-triamcinolone ointment (MYCOLOG)  Has the patient contacted their pharmacy? No (Agent: If no, request that the patient contact the pharmacy for the refill. If patient does not wish to contact the pharmacy document the reason why and proceed with request.) (Agent: If yes, when and what did the pharmacy advise?)  Is this the correct pharmacy for this prescription? Yes If no, delete pharmacy and type the correct one.  This is the patient's preferred pharmacy:  [id number] - [place], [place] - [address] [place] [address] Phone: [phone] Fax: [phone]   Has the prescription been filled recently? No  Is the patient out of the medication? Yes  Has the patient been seen for an appointment in the last year OR does the patient have an upcoming appointment? Yes  Can we respond through MyChart? No  Agent: Please be advised that Rx refills may take up to 3 business days. We ask that you follow-up with your pharmacy.

## 2023-07-02 MED ORDER — NYSTATIN-TRIAMCINOLONE 100000-0.1 UNIT/GM-% EX OINT: 1.0000 | TOPICAL_OINTMENT | Freq: Two times a day (BID) | CUTANEOUS | 1 refills | Status: DC

## 2023-07-02 NOTE — Telephone Encounter (Signed)
 Requested medication (s) are due for refill today: Yes  Requested medication (s) are on the active medication list: Yes  Last refill:  02/04/23  Future visit scheduled: Yes  Notes to clinic:  Manual review.    Requested Prescriptions  Pending Prescriptions Disp Refills   nystatin-triamcinolone ointment (MYCOLOG) 60 g 1    Sig: Apply 1 Application topically 2 (two) times daily.     Off-Protocol Failed - 07/02/2023  8:24 AM      Failed - Medication not assigned to a protocol, review manually.      Passed - Valid encounter within last 12 months    Recent Outpatient Visits           3 months ago Essential hypertension   [place] [facility] Larae Grooms, NP   9 months ago Annual physical exam   [place] [facility] Larae Grooms, NP   1 year ago Dry skin dermatitis   [place] [facility] Larae Grooms, NP   1 year ago Essential hypertension   [place] [facility] Larae Grooms, NP   1 year ago Annual physical exam   [place] [facility] Larae Grooms, NP       Future Appointments             In 2 months Larae Grooms, NP  [place] [facility]

## 2023-09-05 ENCOUNTER — Other Ambulatory Visit: Payer: Self-pay | Admitting: Nurse Practitioner

## 2023-09-11 ENCOUNTER — Ambulatory Visit (INDEPENDENT_AMBULATORY_CARE_PROVIDER_SITE_OTHER): Payer: Self-pay | Admitting: Nurse Practitioner

## 2023-09-11 ENCOUNTER — Encounter: Payer: Self-pay | Admitting: Nurse Practitioner

## 2023-09-11 VITALS — BP 131/82 | HR 64 | Temp 98.4°F | Resp 15 | Ht 69.02 in | Wt 207.4 lb

## 2023-09-11 DIAGNOSIS — Z6830 Body mass index (BMI) 30.0-30.9, adult: Secondary | ICD-10-CM

## 2023-09-11 DIAGNOSIS — Z0000 Encounter for general adult medical examination without abnormal findings: Secondary | ICD-10-CM | POA: Diagnosis not present

## 2023-09-11 DIAGNOSIS — E6609 Other obesity due to excess calories: Secondary | ICD-10-CM

## 2023-09-11 DIAGNOSIS — I10 Essential (primary) hypertension: Secondary | ICD-10-CM | POA: Diagnosis not present

## 2023-09-11 DIAGNOSIS — E66811 Obesity, class 1: Secondary | ICD-10-CM

## 2023-09-11 DIAGNOSIS — E7800 Pure hypercholesterolemia, unspecified: Secondary | ICD-10-CM | POA: Diagnosis not present

## 2023-09-11 DIAGNOSIS — F17200 Nicotine dependence, unspecified, uncomplicated: Secondary | ICD-10-CM | POA: Diagnosis not present

## 2023-09-11 MED ORDER — ATORVASTATIN CALCIUM 10 MG PO TABS: 10.0000 mg | ORAL_TABLET | Freq: Every day | ORAL | 1 refills | Status: DC

## 2023-09-11 MED ORDER — AMLODIPINE BESYLATE 10 MG PO TABS: 10.0000 mg | ORAL_TABLET | Freq: Every day | ORAL | 1 refills | Status: DC

## 2023-09-11 MED ORDER — NYSTATIN-TRIAMCINOLONE 100000-0.1 UNIT/GM-% EX OINT: 1.0000 | TOPICAL_OINTMENT | Freq: Two times a day (BID) | CUTANEOUS | 3 refills | Status: AC

## 2023-09-11 MED ORDER — LOSARTAN POTASSIUM 100 MG PO TABS: 100.0000 mg | ORAL_TABLET | Freq: Every day | ORAL | 1 refills | Status: DC

## 2023-09-11 NOTE — Assessment & Plan Note (Signed)
 Chronic.  Controlled.  Continue with current medication regimen of Atorvastatin  10mg  daily.  Refills sent today.  Labs ordered today.  Return to clinic in 6 months for reevaluation.  Call sooner if concerns arise.   The 10-year ASCVD risk score (Arnett DK, et al., 2019) is: 19.8%   Values used to calculate the score:     Age: 63 years     Sex: Male     Is Non-Hispanic African American: No     Diabetic: No     Tobacco smoker: Yes     Systolic Blood Pressure: 131 mmHg     Is BP treated: Yes     HDL Cholesterol: 36 mg/dL     Total Cholesterol: 161 mg/dL

## 2023-09-11 NOTE — Progress Notes (Deleted)
 There were no vitals taken for this visit.   Subjective:    Patient ID: Jeffrey Hunter, male    DOB: 1960/10/30, 63 y.o.   MRN: [id number]  HPI: Jeffrey Hunter is a 63 y.o. male  No chief complaint on file.  HYPERTENSION / HYPERLIPIDEMIA Satisfied with current treatment? no Duration of hypertension: years BP monitoring frequency: not checking BP range:  BP medication side effects: no Past BP meds: amlodipine  and Losartan  100mg  Duration of hyperlipidemia: years Cholesterol medication side effects: no Cholesterol supplements: none Past cholesterol medications: atorvastain (lipitor) Medication compliance: excellent compliance Aspirin: no Recent stressors: no Recurrent headaches: no Visual changes: no Palpitations: no Dyspnea: no Chest pain: no Lower extremity edema: no Dizzy/lightheaded: no  Patient states he has been having more arthritis pain in his shoulders.    Relevant past medical, surgical, family and social history reviewed and updated as indicated. Interim medical history since our last visit reviewed. Allergies and medications reviewed and updated.  Review of Systems  Eyes:  Negative for visual disturbance.  Respiratory:  Negative for chest tightness and shortness of breath.   Cardiovascular:  Negative for chest pain, palpitations and leg swelling.  Musculoskeletal:        Bilateral shoulder pain  Neurological:  Negative for dizziness, light-headedness and headaches.    Per HPI unless specifically indicated above     Objective:    There were no vitals taken for this visit.  Wt Readings from Last 3 Encounters:  03/13/23 211 lb (95.7 kg)  09/08/22 215 lb (97.5 kg)  04/12/22 211 lb 14.4 oz (96.1 kg)    Physical Exam Vitals and nursing note reviewed.  Constitutional:      General: He is not in acute distress.    Appearance: Normal appearance. He is not ill-appearing, toxic-appearing or diaphoretic.  HENT:     Head: Normocephalic.     Right  Ear: External ear normal.     Left Ear: External ear normal.     Nose: Nose normal. No congestion or rhinorrhea.     Mouth/Throat:     Mouth: Mucous membranes are moist.  Eyes:     General:        Right eye: No discharge.        Left eye: No discharge.     Extraocular Movements: Extraocular movements intact.     Conjunctiva/sclera: Conjunctivae normal.     Pupils: Pupils are equal, round, and reactive to light.  Cardiovascular:     Rate and Rhythm: Normal rate and regular rhythm.     Heart sounds: No murmur heard. Pulmonary:     Effort: Pulmonary effort is normal. No respiratory distress.     Breath sounds: Normal breath sounds. No wheezing, rhonchi or rales.  Abdominal:     General: Abdomen is flat. Bowel sounds are normal.  Musculoskeletal:     Cervical back: Normal range of motion and neck supple.  Skin:    General: Skin is warm and dry.     Capillary Refill: Capillary refill takes less than 2 seconds.  Neurological:     General: No focal deficit present.     Mental Status: He is alert and oriented to person, place, and time.  Psychiatric:        Mood and Affect: Mood normal.        Behavior: Behavior normal.        Thought Content: Thought content normal.        Judgment: Judgment normal.  Results for orders placed or performed in visit on 03/13/23  Comp Met (CMET)   Collection Time: 03/13/23  8:59 AM  Result Value Ref Range   Glucose 85 70 - 99 mg/dL   BUN 14 8 - 27 mg/dL   Creatinine, Ser 1.47 0.76 - 1.27 mg/dL   eGFR 75 >82 NF/AOZ/3.08   BUN/Creatinine Ratio 13 10 - 24   Sodium 142 134 - 144 mmol/L   Potassium 4.6 3.5 - 5.2 mmol/L   Chloride 104 96 - 106 mmol/L   CO2 24 20 - 29 mmol/L   Calcium  9.2 8.6 - 10.2 mg/dL   Total Protein 6.7 6.0 - 8.5 g/dL   Albumin 4.4 3.9 - 4.9 g/dL   Globulin, Total 2.3 1.5 - 4.5 g/dL   Bilirubin Total 0.7 0.0 - 1.2 mg/dL   Alkaline Phosphatase 103 44 - 121 IU/L   AST 18 0 - 40 IU/L   ALT 18 0 - 44 IU/L  Lipid Profile    Collection Time: 03/13/23  8:59 AM  Result Value Ref Range   Cholesterol, Total 161 100 - 199 mg/dL   Triglycerides 657 (H) 0 - 149 mg/dL   HDL 36 (L) >84 mg/dL   VLDL Cholesterol Cal 29 5 - 40 mg/dL   LDL Chol Calc (NIH) 96 0 - 99 mg/dL   Chol/HDL Ratio 4.5 0.0 - 5.0 ratio      Assessment & Plan:   Problem List Items Addressed This Visit       Cardiovascular and Mediastinum   Essential hypertension     Other   Hyperlipidemia - Primary   Obesity     Follow up plan: No follow-ups on file.

## 2023-09-11 NOTE — Assessment & Plan Note (Signed)
 Chronic.  Controlled.  Continue with current medication regimen of Losartan  and Amlodipine .  Reports tolerating medications well.  Labs ordered today.  Return to clinic in 6 months for reevaluation.  Call sooner if concerns arise.

## 2023-09-11 NOTE — Assessment & Plan Note (Signed)
 Recommended eating smaller high protein, low fat meals more frequently and exercising 30 mins a day 5 times a week with a goal of 10-15lb weight loss in the next 3 months.

## 2023-09-11 NOTE — Progress Notes (Signed)
 BP 131/82 (BP Location: Left Arm, Patient Position: Sitting, Cuff Size: Large)   Pulse 64   Temp 98.4 F (36.9 C) (Oral)   Resp 15   Ht 5' 9.02" (1.753 m)   Wt 207 lb 6.4 oz (94.1 kg)   SpO2 98%   BMI 30.61 kg/m    Subjective:    Patient ID: Jeffrey Hunter, male    DOB: July 11, 1960, 63 y.o.   MRN: [id number]  HPI: Jeffrey Hunter is a 63 y.o. male presenting on 09/11/2023 for comprehensive medical examination. Current medical complaints include:none  He currently lives with: Interim Problems from his last visit: no  HYPERTENSION / HYPERLIPIDEMIA Satisfied with current treatment? no Duration of hypertension: years BP monitoring frequency: not checking BP range:  BP medication side effects: no Past BP meds: amlodipine  and Losartan  100mg  Duration of hyperlipidemia: years Cholesterol medication side effects: no Cholesterol supplements: none Past cholesterol medications: atorvastain (lipitor) Medication compliance: excellent compliance Aspirin: no Recent stressors: no Recurrent headaches: no Visual changes: no Palpitations: no Dyspnea: no Chest pain: no Lower extremity edema: no Dizzy/lightheaded: no  Current everyday smoker.  Smoking about 1/2ppd.   Depression Screen done today and results listed below:     09/11/2023    8:28 AM 03/13/2023    8:39 AM 09/08/2022    8:11 AM 03/09/2022    8:12 AM 09/06/2021    8:15 AM  Depression screen PHQ 2/9  Decreased Interest 0 0 0 0 0  Down, Depressed, Hopeless 0 0 0 0 0  PHQ - 2 Score 0 0 0 0 0  Altered sleeping 0 0 0 0 0  Tired, decreased energy 0 0 0 0 0  Change in appetite 0 0 0 0 0  Feeling bad or failure about yourself  0 0 0 0 0  Trouble concentrating 0 0 0 0 0  Moving slowly or fidgety/restless 0 0 0 0 0  Suicidal thoughts 0 0 0 0 0  PHQ-9 Score 0 0 0 0 0  Difficult doing work/chores Not difficult at all   Not difficult at all Not difficult at all    The patient does not have a history of falls. I did  complete a risk assessment for falls. A plan of care for falls was documented.   Past Medical History:  Past Medical History:  Diagnosis Date   Chronic lower back pain    Hyperlipidemia    Hypertension     Surgical History:  Past Surgical History:  Procedure Laterality Date   vascectomy      Medications:  Current Outpatient Medications on File Prior to Visit  Medication Sig   ASPIRIN 81 PO Take by mouth as needed.   sildenafil  (VIAGRA ) 100 MG tablet TAKE 0.5-1 TABLETS BY MOUTH DAILY AS NEEDED FOR ERECTILE DYSFUNCTION.   No current facility-administered medications on file prior to visit.    Allergies:  No Known Allergies  Social History:  Social History   Socioeconomic History   Marital status: Married    Spouse name: Leary Provencal   Number of children: 2   Years of education: Not on file   Highest education level: Not on file  Occupational History   Not on file  Tobacco Use   Smoking status: Every Day    Current packs/day: 0.50    Average packs/day: 0.5 packs/day for 41.4 years (20.7 ttl pk-yrs)    Types: Cigarettes    Start date: 04/24/1982    Passive exposure: Current   Smokeless  tobacco: Never  Vaping Use   Vaping status: Never Used  Substance and Sexual Activity   Alcohol use: Yes    Comment: ocassionally   Drug use: Never   Sexual activity: Yes  Other Topics Concern   Not on file  Social History Narrative   Not on file   Social Drivers of Health   Financial Resource Strain: Not on file  Food Insecurity: Not on file  Transportation Needs: Not on file  Physical Activity: Sufficiently Active (09/11/2023)   Exercise Vital Sign    Days of Exercise per Week: 1 day    Minutes of Exercise per Session: 150+ min  Stress: No Stress Concern Present (09/11/2023)   [employer] of Occupational Health - Occupational Stress Questionnaire    Feeling of Stress : Not at all  Social Connections: Moderately Integrated (09/11/2023)   Social Connection and Isolation  Panel [NHANES]    Frequency of Communication with Friends and Family: Three times a week    Frequency of Social Gatherings with Friends and Family: More than three times a week    Attends Religious Services: 1 to 4 times per year    Active Member of [employer] or Organizations: No    Attends Banker Meetings: Never    Marital Status: Married  Catering manager Violence: Not on file   Social History   Tobacco Use  Smoking Status Every Day   Current packs/day: 0.50   Average packs/day: 0.5 packs/day for 41.4 years (20.7 ttl pk-yrs)   Types: Cigarettes   Start date: 04/24/1982   Passive exposure: Current  Smokeless Tobacco Never   Social History   Substance and Sexual Activity  Alcohol Use Yes   Comment: ocassionally    Family History:  Family History  Problem Relation Age of Onset   Diabetes Sister    Heart attack Paternal Grandfather     Past medical history, surgical history, medications, allergies, family history and social history reviewed with patient today and changes made to appropriate areas of the chart.   Review of Systems  Eyes:  Negative for blurred vision and double vision.  Respiratory:  Negative for shortness of breath.   Cardiovascular:  Negative for chest pain, palpitations and leg swelling.  Neurological:  Negative for dizziness and headaches.   All other ROS negative except what is listed above and in the HPI.      Objective:     BP 131/82 (BP Location: Left Arm, Patient Position: Sitting, Cuff Size: Large)   Pulse 64   Temp 98.4 F (36.9 C) (Oral)   Resp 15   Ht 5' 9.02" (1.753 m)   Wt 207 lb 6.4 oz (94.1 kg)   SpO2 98%   BMI 30.61 kg/m   Wt Readings from Last 3 Encounters:  09/11/23 207 lb 6.4 oz (94.1 kg)  03/13/23 211 lb (95.7 kg)  09/08/22 215 lb (97.5 kg)    Physical Exam Vitals and nursing note reviewed.  Constitutional:      General: He is not in acute distress.    Appearance: Normal appearance. He is not ill-appearing,  toxic-appearing or diaphoretic.  HENT:     Head: Normocephalic.     Right Ear: Tympanic membrane, ear canal and external ear normal.     Left Ear: Tympanic membrane, ear canal and external ear normal.     Nose: Nose normal. No congestion or rhinorrhea.     Mouth/Throat:     Mouth: Mucous membranes are moist.  Eyes:  General:        Right eye: No discharge.        Left eye: No discharge.     Extraocular Movements: Extraocular movements intact.     Conjunctiva/sclera: Conjunctivae normal.     Pupils: Pupils are equal, round, and reactive to light.  Cardiovascular:     Rate and Rhythm: Normal rate and regular rhythm.     Heart sounds: No murmur heard. Pulmonary:     Effort: Pulmonary effort is normal. No respiratory distress.     Breath sounds: Normal breath sounds. No wheezing, rhonchi or rales.  Abdominal:     General: Abdomen is flat. Bowel sounds are normal. There is no distension.     Palpations: Abdomen is soft.     Tenderness: There is no abdominal tenderness. There is no guarding.  Musculoskeletal:     Cervical back: Normal range of motion and neck supple.  Skin:    General: Skin is warm and dry.     Capillary Refill: Capillary refill takes less than 2 seconds.  Neurological:     General: No focal deficit present.     Mental Status: He is alert and oriented to person, place, and time.     Cranial Nerves: No cranial nerve deficit.     Motor: No weakness.     Deep Tendon Reflexes: Reflexes normal.  Psychiatric:        Mood and Affect: Mood normal.        Behavior: Behavior normal.        Thought Content: Thought content normal.        Judgment: Judgment normal.     Results for orders placed or performed in visit on 03/13/23  Comp Met (CMET)   Collection Time: 03/13/23  8:59 AM  Result Value Ref Range   Glucose 85 70 - 99 mg/dL   BUN 14 8 - 27 mg/dL   Creatinine, Ser 9.56 0.76 - 1.27 mg/dL   eGFR 75 >21 HY/QMV/7.84   BUN/Creatinine Ratio 13 10 - 24   Sodium  142 134 - 144 mmol/L   Potassium 4.6 3.5 - 5.2 mmol/L   Chloride 104 96 - 106 mmol/L   CO2 24 20 - 29 mmol/L   Calcium  9.2 8.6 - 10.2 mg/dL   Total Protein 6.7 6.0 - 8.5 g/dL   Albumin 4.4 3.9 - 4.9 g/dL   Globulin, Total 2.3 1.5 - 4.5 g/dL   Bilirubin Total 0.7 0.0 - 1.2 mg/dL   Alkaline Phosphatase 103 44 - 121 IU/L   AST 18 0 - 40 IU/L   ALT 18 0 - 44 IU/L  Lipid Profile   Collection Time: 03/13/23  8:59 AM  Result Value Ref Range   Cholesterol, Total 161 100 - 199 mg/dL   Triglycerides 696 (H) 0 - 149 mg/dL   HDL 36 (L) >29 mg/dL   VLDL Cholesterol Cal 29 5 - 40 mg/dL   LDL Chol Calc (NIH) 96 0 - 99 mg/dL   Chol/HDL Ratio 4.5 0.0 - 5.0 ratio      Assessment & Plan:   Problem List Items Addressed This Visit       Cardiovascular and Mediastinum   Essential hypertension   Chronic.  Controlled.  Continue with current medication regimen of Losartan  and Amlodipine .  Reports tolerating medications well.  Labs ordered today.  Return to clinic in 6 months for reevaluation.  Call sooner if concerns arise.       Relevant Medications  amLODipine  (NORVASC ) 10 MG tablet   atorvastatin  (LIPITOR) 10 MG tablet   losartan  (COZAAR ) 100 MG tablet     Other   Hyperlipidemia   Chronic.  Controlled.  Continue with current medication regimen of Atorvastatin  10mg  daily.  Refills sent today.  Labs ordered today.  Return to clinic in 6 months for reevaluation.  Call sooner if concerns arise.   The 10-year ASCVD risk score (Arnett DK, et al., 2019) is: 19.8%   Values used to calculate the score:     Age: 65 years     Sex: Male     Is Non-Hispanic African American: No     Diabetic: No     Tobacco smoker: Yes     Systolic Blood Pressure: 131 mmHg     Is BP treated: Yes     HDL Cholesterol: 36 mg/dL     Total Cholesterol: 161 mg/dL       Relevant Medications   amLODipine  (NORVASC ) 10 MG tablet   atorvastatin  (LIPITOR) 10 MG tablet   losartan  (COZAAR ) 100 MG tablet   Obesity    Recommended eating smaller high protein, low fat meals more frequently and exercising 30 mins a day 5 times a week with a goal of 10-15lb weight loss in the next 3 months.       Other Visit Diagnoses       Annual physical exam    -  Primary   Health maintenance reviewed during visit today.  Labs ordered.  Vaccines reviewed. Colon cancer screening up to date.   Relevant Orders   TSH   PSA   Lipid panel   CBC with Differential/Platelet   Comprehensive metabolic panel with GFR     Current every day smoker       CT Lung screening ordered during visit.   Relevant Orders   Ambulatory Referral Lung Cancer Screening [facility]        Discussed aspirin prophylaxis for myocardial infarction prevention and decision was it was not indicated  LABORATORY TESTING:  Health maintenance labs ordered today as discussed above.   The natural history of prostate cancer and ongoing controversy regarding screening and potential treatment outcomes of prostate cancer has been discussed with the patient. The meaning of a false positive PSA and a false negative PSA has been discussed. He indicates understanding of the limitations of this screening test and wishes to proceed with screening PSA testing.   IMMUNIZATIONS:   - Tdap: Tetanus vaccination status reviewed: last tetanus booster within 10 years. - Influenza: Postponed to flu season - Pneumovax: up to date  - Prevnar: Up to date - COVID: Not applicable - HPV: Not applicable - Shingrix vaccine: Discussed at visit today  SCREENING: - Colonoscopy: Up to date  Discussed with patient purpose of the colonoscopy is to detect colon cancer at curable precancerous or early stages   - AAA Screening: Not applicable  -Hearing Test: Not applicable  -Spirometry: Not applicable   PATIENT COUNSELING:    Sexuality: Discussed sexually transmitted diseases, partner selection, use of condoms, avoidance of unintended pregnancy  and contraceptive  alternatives.   Advised to avoid cigarette smoking.  I discussed with the patient that most people either abstain from alcohol or drink within safe limits (<=14/week and <=4 drinks/occasion for males, <=7/weeks and <= 3 drinks/occasion for females) and that the risk for alcohol disorders and other health effects rises proportionally with the number of drinks per week and how often a drinker exceeds daily limits.  Discussed cessation/primary prevention of drug use and availability of treatment for abuse.   Diet: Encouraged to adjust caloric intake to maintain  or achieve ideal body weight, to reduce intake of dietary saturated fat and total fat, to limit sodium intake by avoiding high sodium foods and not adding table salt, and to maintain adequate dietary potassium and calcium  preferably from fresh fruits, vegetables, and low-fat dairy products.    stressed the importance of regular exercise  Injury prevention: Discussed safety belts, safety helmets, smoke detector, smoking near bedding or upholstery.   Dental health: Discussed importance of regular tooth brushing, flossing, and dental visits.   Follow up plan: NEXT PREVENTATIVE PHYSICAL DUE IN 1 YEAR. Return in about 6 months (around 03/13/2024) for HTN, HLD, DM2 FU.

## 2023-09-12 ENCOUNTER — Ambulatory Visit: Payer: Self-pay | Admitting: Nurse Practitioner

## 2023-09-12 LAB — CBC WITH DIFFERENTIAL/PLATELET
Basophils Absolute: 0 10*3/uL (ref 0.0–0.2)
Basos: 1 %
EOS (ABSOLUTE): 0.3 10*3/uL (ref 0.0–0.4)
Eos: 3 %
Hematocrit: 50.6 % (ref 37.5–51.0)
Hemoglobin: 16.6 g/dL (ref 13.0–17.7)
Immature Grans (Abs): 0 10*3/uL (ref 0.0–0.1)
Immature Granulocytes: 0 %
Lymphocytes Absolute: 1.1 10*3/uL (ref 0.7–3.1)
Lymphs: 14 %
MCH: 29.7 pg (ref 26.6–33.0)
MCHC: 32.8 g/dL (ref 31.5–35.7)
MCV: 91 fL (ref 79–97)
Monocytes Absolute: 0.6 10*3/uL (ref 0.1–0.9)
Monocytes: 8 %
Neutrophils Absolute: 5.6 10*3/uL (ref 1.4–7.0)
Neutrophils: 74 %
Platelets: 172 10*3/uL (ref 150–450)
RBC: 5.58 x10E6/uL (ref 4.14–5.80)
RDW: 13.8 % (ref 11.6–15.4)
WBC: 7.6 10*3/uL (ref 3.4–10.8)

## 2023-09-12 LAB — COMPREHENSIVE METABOLIC PANEL WITH GFR
ALT: 18 IU/L (ref 0–44)
AST: 15 IU/L (ref 0–40)
Albumin: 4.3 g/dL (ref 3.9–4.9)
Alkaline Phosphatase: 101 IU/L (ref 44–121)
BUN/Creatinine Ratio: 21 (ref 10–24)
BUN: 21 mg/dL (ref 8–27)
Bilirubin Total: 0.3 mg/dL (ref 0.0–1.2)
CO2: 22 mmol/L (ref 20–29)
Calcium: 9.5 mg/dL (ref 8.6–10.2)
Chloride: 105 mmol/L (ref 96–106)
Creatinine, Ser: 0.99 mg/dL (ref 0.76–1.27)
Globulin, Total: 2.1 g/dL (ref 1.5–4.5)
Glucose: 87 mg/dL (ref 70–99)
Potassium: 4.4 mmol/L (ref 3.5–5.2)
Sodium: 142 mmol/L (ref 134–144)
Total Protein: 6.4 g/dL (ref 6.0–8.5)
eGFR: 86 mL/min/{1.73_m2} (ref >59)

## 2023-09-12 LAB — LIPID PANEL
Chol/HDL Ratio: 4.2 ratio (ref 0.0–5.0)
Cholesterol, Total: 143 mg/dL (ref 100–199)
HDL: 34 mg/dL — ABNORMAL LOW (ref >39)
LDL Chol Calc (NIH): 88 mg/dL (ref 0–99)
Triglycerides: 114 mg/dL (ref 0–149)
VLDL Cholesterol Cal: 21 mg/dL (ref 5–40)

## 2023-09-12 LAB — PSA: Prostate Specific Ag, Serum: 0.4 ng/mL (ref 0.0–4.0)

## 2023-09-12 LAB — TSH: TSH: 0.63 u[IU]/mL (ref 0.450–4.500)

## 2024-03-14 ENCOUNTER — Ambulatory Visit (INDEPENDENT_AMBULATORY_CARE_PROVIDER_SITE_OTHER): Admitting: Nurse Practitioner

## 2024-03-14 ENCOUNTER — Encounter: Payer: Self-pay | Admitting: Nurse Practitioner

## 2024-03-14 VITALS — BP 128/80 | HR 58 | Temp 98.1°F | Ht 69.02 in | Wt 206.6 lb

## 2024-03-14 DIAGNOSIS — E7800 Pure hypercholesterolemia, unspecified: Secondary | ICD-10-CM | POA: Diagnosis not present

## 2024-03-14 DIAGNOSIS — I10 Essential (primary) hypertension: Secondary | ICD-10-CM | POA: Diagnosis not present

## 2024-03-14 DIAGNOSIS — E6609 Other obesity due to excess calories: Secondary | ICD-10-CM

## 2024-03-14 DIAGNOSIS — F17210 Nicotine dependence, cigarettes, uncomplicated: Secondary | ICD-10-CM | POA: Diagnosis not present

## 2024-03-14 DIAGNOSIS — Z6830 Body mass index (BMI) 30.0-30.9, adult: Secondary | ICD-10-CM

## 2024-03-14 DIAGNOSIS — E66811 Obesity, class 1: Secondary | ICD-10-CM | POA: Diagnosis not present

## 2024-03-14 MED ORDER — ATORVASTATIN CALCIUM 10 MG PO TABS: 10.0000 mg | ORAL_TABLET | Freq: Every day | ORAL | 1 refills | Status: AC

## 2024-03-14 MED ORDER — AMLODIPINE BESYLATE 10 MG PO TABS: 10.0000 mg | ORAL_TABLET | Freq: Every day | ORAL | 1 refills | Status: AC

## 2024-03-14 MED ORDER — LOSARTAN POTASSIUM 100 MG PO TABS: 100.0000 mg | ORAL_TABLET | Freq: Every day | ORAL | 1 refills | Status: AC

## 2024-03-14 MED ORDER — SILDENAFIL CITRATE 100 MG PO TABS: 100.0000 mg | ORAL_TABLET | ORAL | 3 refills | Status: AC | PRN

## 2024-03-14 NOTE — Assessment & Plan Note (Signed)
 Chronic.  Controlled.  Continue with current medication regimen of Losartan  and Amlodipine .  Reports tolerating medications well.  Recommend checking blood pressures at home and bringing log to next visit.  Labs ordered today.  Return to clinic in 6 months for reevaluation.  Call sooner if concerns arise.

## 2024-03-14 NOTE — Assessment & Plan Note (Signed)
 Ongoing.  Smoking about 1/2ppd.  Declined Lung Cancer Screening.

## 2024-03-14 NOTE — Progress Notes (Signed)
 BP 128/80 (BP Location: Right Arm, Patient Position: Sitting, Cuff Size: Normal)   Pulse (!) 58   Temp 98.1 F (36.7 C) (Oral)   Ht 5' 9.02 (1.753 m)   Wt 206 lb 9.6 oz (93.7 kg)   SpO2 98%   BMI 30.50 kg/m    Subjective:    Patient ID: Jeffrey Hunter, male    DOB: 02-Nov-1960, 63 y.o.   MRN: [id number]  HPI: Jeffrey Hunter is a 63 y.o. male  Chief Complaint  Patient presents with   Hyperlipidemia   Weight Management Screening   HYPERTENSION / HYPERLIPIDEMIA Doing well with current medications.  No concerns at this time. Satisfied with current treatment? no Duration of hypertension: years BP monitoring frequency: not checking BP range:  BP medication side effects: no Past BP meds: amlodipine  and Losartan  100mg  Duration of hyperlipidemia: years Cholesterol medication side effects: no Cholesterol supplements: none Past cholesterol medications: atorvastain (lipitor) Medication compliance: excellent compliance Aspirin: no Recent stressors: no Recurrent headaches: no Visual changes: no Palpitations: no Dyspnea: no Chest pain: no Lower extremity edema: no Dizzy/lightheaded: no  Current everyday smoker.  Smoking about 1/2ppd.  Relevant past medical, surgical, family and social history reviewed and updated as indicated. Interim medical history since our last visit reviewed. Allergies and medications reviewed and updated.  Review of Systems  Eyes:  Negative for visual disturbance.  Respiratory:  Negative for chest tightness and shortness of breath.   Cardiovascular:  Negative for chest pain, palpitations and leg swelling.  Neurological:  Negative for dizziness, light-headedness and headaches.    Per HPI unless specifically indicated above     Objective:    BP 128/80 (BP Location: Right Arm, Patient Position: Sitting, Cuff Size: Normal)   Pulse (!) 58   Temp 98.1 F (36.7 C) (Oral)   Ht 5' 9.02 (1.753 m)   Wt 206 lb 9.6 oz (93.7 kg)   SpO2 98%   BMI  30.50 kg/m   Wt Readings from Last 3 Encounters:  03/14/24 206 lb 9.6 oz (93.7 kg)  09/11/23 207 lb 6.4 oz (94.1 kg)  03/13/23 211 lb (95.7 kg)    Physical Exam Vitals and nursing note reviewed.  Constitutional:      General: He is not in acute distress.    Appearance: Normal appearance. He is not ill-appearing, toxic-appearing or diaphoretic.  HENT:     Head: Normocephalic.     Right Ear: External ear normal.     Left Ear: External ear normal.     Nose: Nose normal. No congestion or rhinorrhea.     Mouth/Throat:     Mouth: Mucous membranes are moist.  Eyes:     General:        Right eye: No discharge.        Left eye: No discharge.     Extraocular Movements: Extraocular movements intact.     Conjunctiva/sclera: Conjunctivae normal.     Pupils: Pupils are equal, round, and reactive to light.  Cardiovascular:     Rate and Rhythm: Normal rate and regular rhythm.     Heart sounds: No murmur heard. Pulmonary:     Effort: Pulmonary effort is normal. No respiratory distress.     Breath sounds: Normal breath sounds. No wheezing, rhonchi or rales.  Abdominal:     General: Abdomen is flat. Bowel sounds are normal.  Musculoskeletal:     Cervical back: Normal range of motion and neck supple.  Skin:    General: Skin is  warm and dry.     Capillary Refill: Capillary refill takes less than 2 seconds.  Neurological:     General: No focal deficit present.     Mental Status: He is alert and oriented to person, place, and time.  Psychiatric:        Mood and Affect: Mood normal.        Behavior: Behavior normal.        Thought Content: Thought content normal.        Judgment: Judgment normal.     Results for orders placed or performed in visit on 09/11/23  TSH   Collection Time: 09/11/23  8:56 AM  Result Value Ref Range   TSH 0.630 0.450 - 4.500 uIU/mL  PSA   Collection Time: 09/11/23  8:56 AM  Result Value Ref Range   Prostate Specific Ag, Serum 0.4 0.0 - 4.0 ng/mL  Lipid  panel   Collection Time: 09/11/23  8:56 AM  Result Value Ref Range   Cholesterol, Total 143 100 - 199 mg/dL   Triglycerides 885 0 - 149 mg/dL   HDL 34 (L) >60 mg/dL   VLDL Cholesterol Cal 21 5 - 40 mg/dL   LDL Chol Calc (NIH) 88 0 - 99 mg/dL   Chol/HDL Ratio 4.2 0.0 - 5.0 ratio  CBC with Differential/Platelet   Collection Time: 09/11/23  8:56 AM  Result Value Ref Range   WBC 7.6 3.4 - 10.8 x10E3/uL   RBC 5.58 4.14 - 5.80 x10E6/uL   Hemoglobin 16.6 13.0 - 17.7 g/dL   Hematocrit 49.3 62.4 - 51.0 %   MCV 91 79 - 97 fL   MCH 29.7 26.6 - 33.0 pg   MCHC 32.8 31.5 - 35.7 g/dL   RDW 86.1 88.3 - 84.5 %   Platelets 172 150 - 450 x10E3/uL   Neutrophils 74 Not Estab. %   Lymphs 14 Not Estab. %   Monocytes 8 Not Estab. %   Eos 3 Not Estab. %   Basos 1 Not Estab. %   Neutrophils Absolute 5.6 1.4 - 7.0 x10E3/uL   Lymphocytes Absolute 1.1 0.7 - 3.1 x10E3/uL   Monocytes Absolute 0.6 0.1 - 0.9 x10E3/uL   EOS (ABSOLUTE) 0.3 0.0 - 0.4 x10E3/uL   Basophils Absolute 0.0 0.0 - 0.2 x10E3/uL   Immature Granulocytes 0 Not Estab. %   Immature Grans (Abs) 0.0 0.0 - 0.1 x10E3/uL  Comprehensive metabolic panel with GFR   Collection Time: 09/11/23  8:56 AM  Result Value Ref Range   Glucose 87 70 - 99 mg/dL   BUN 21 8 - 27 mg/dL   Creatinine, Ser 9.00 0.76 - 1.27 mg/dL   eGFR 86 >40 fO/fpw/8.26   BUN/Creatinine Ratio 21 10 - 24   Sodium 142 134 - 144 mmol/L   Potassium 4.4 3.5 - 5.2 mmol/L   Chloride 105 96 - 106 mmol/L   CO2 22 20 - 29 mmol/L   Calcium  9.5 8.6 - 10.2 mg/dL   Total Protein 6.4 6.0 - 8.5 g/dL   Albumin 4.3 3.9 - 4.9 g/dL   Globulin, Total 2.1 1.5 - 4.5 g/dL   Bilirubin Total 0.3 0.0 - 1.2 mg/dL   Alkaline Phosphatase 101 44 - 121 IU/L   AST 15 0 - 40 IU/L   ALT 18 0 - 44 IU/L      Assessment & Plan:   Problem List Items Addressed This Visit       Cardiovascular and Mediastinum   Essential hypertension   Chronic.  Controlled.  Continue with current medication regimen of  Losartan  and Amlodipine .  Reports tolerating medications well.  Recommend checking blood pressures at home and bringing log to next visit.  Labs ordered today.  Return to clinic in 6 months for reevaluation.  Call sooner if concerns arise.       Relevant Medications   amLODipine  (NORVASC ) 10 MG tablet   atorvastatin  (LIPITOR) 10 MG tablet   losartan  (COZAAR ) 100 MG tablet   sildenafil  (VIAGRA ) 100 MG tablet   Other Relevant Orders   Comprehensive metabolic panel with GFR     Other   Hyperlipidemia   Chronic.  Controlled.  Continue with current medication regimen of Atorvastatin  10mg  daily.  Refills sent today.  Labs ordered today.  Return to clinic in 6 months for reevaluation.  Call sooner if concerns arise.   The 10-year ASCVD risk score (Arnett DK, et al., 2019) is: 18.1%   Values used to calculate the score:     Age: 53 years     Clincally relevant sex: Male     Is Non-Hispanic African American: No     Diabetic: No     Tobacco smoker: Yes     Systolic Blood Pressure: 128 mmHg     Is BP treated: Yes     HDL Cholesterol: 34 mg/dL     Total Cholesterol: 143 mg/dL      Relevant Medications   amLODipine  (NORVASC ) 10 MG tablet   atorvastatin  (LIPITOR) 10 MG tablet   losartan  (COZAAR ) 100 MG tablet   sildenafil  (VIAGRA ) 100 MG tablet   Other Relevant Orders   Lipid panel   Nicotine dependence, cigarettes, uncomplicated   Ongoing.  Smoking about 1/2ppd.  Declined Lung Cancer Screening.      Obesity - Primary   Recommended eating smaller high protein, low fat meals more frequently and exercising 30 mins a day 5 times a week with a goal of 10-15lb weight loss in the next 3 months.         Follow up plan: Return in about 6 months (around 09/11/2024) for Physical and Fasting labs.

## 2024-03-14 NOTE — Assessment & Plan Note (Signed)
 Chronic.  Controlled.  Continue with current medication regimen of Atorvastatin  10mg  daily.  Refills sent today.  Labs ordered today.  Return to clinic in 6 months for reevaluation.  Call sooner if concerns arise.   The 10-year ASCVD risk score (Arnett DK, et al., 2019) is: 18.1%   Values used to calculate the score:     Age: 63 years     Clincally relevant sex: Male     Is Non-Hispanic African American: No     Diabetic: No     Tobacco smoker: Yes     Systolic Blood Pressure: 128 mmHg     Is BP treated: Yes     HDL Cholesterol: 34 mg/dL     Total Cholesterol: 143 mg/dL

## 2024-03-14 NOTE — Assessment & Plan Note (Signed)
 Recommended eating smaller high protein, low fat meals more frequently and exercising 30 mins a day 5 times a week with a goal of 10-15lb weight loss in the next 3 months.

## 2024-03-15 LAB — COMPREHENSIVE METABOLIC PANEL WITH GFR
ALT: 18 IU/L (ref 0–44)
AST: 14 IU/L (ref 0–40)
Albumin: 4.3 g/dL (ref 3.9–4.9)
Alkaline Phosphatase: 100 IU/L (ref 47–123)
BUN/Creatinine Ratio: 19 (ref 10–24)
BUN: 18 mg/dL (ref 8–27)
Bilirubin Total: 0.5 mg/dL (ref 0.0–1.2)
CO2: 22 mmol/L (ref 20–29)
Calcium: 9.7 mg/dL (ref 8.6–10.2)
Chloride: 105 mmol/L (ref 96–106)
Creatinine, Ser: 0.95 mg/dL (ref 0.76–1.27)
Globulin, Total: 2.3 g/dL (ref 1.5–4.5)
Glucose: 90 mg/dL (ref 70–99)
Potassium: 4.3 mmol/L (ref 3.5–5.2)
Sodium: 139 mmol/L (ref 134–144)
Total Protein: 6.6 g/dL (ref 6.0–8.5)
eGFR: 90 mL/min/1.73 (ref >59)

## 2024-03-15 LAB — LIPID PANEL
Chol/HDL Ratio: 4.4 ratio (ref 0.0–5.0)
Cholesterol, Total: 159 mg/dL (ref 100–199)
HDL: 36 mg/dL — ABNORMAL LOW (ref >39)
LDL Chol Calc (NIH): 104 mg/dL — ABNORMAL HIGH (ref 0–99)
Triglycerides: 102 mg/dL (ref 0–149)
VLDL Cholesterol Cal: 19 mg/dL (ref 5–40)

## 2024-03-17 ENCOUNTER — Ambulatory Visit: Payer: Self-pay | Admitting: Nurse Practitioner

## 2024-09-12 ENCOUNTER — Encounter: Admitting: Nurse Practitioner
